# Patient Record
Sex: Male | Born: 1970 | Race: Black or African American | Hispanic: No | Marital: Married | Smoking: Never smoker
Health system: Southern US, Community
[De-identification: ages and names within clinical notes are randomized; demographics above are authoritative.]

## PROBLEM LIST (undated history)

## (undated) DIAGNOSIS — C801 Malignant (primary) neoplasm, unspecified: Secondary | ICD-10-CM

## (undated) DIAGNOSIS — K219 Gastro-esophageal reflux disease without esophagitis: Secondary | ICD-10-CM

## (undated) DIAGNOSIS — Z889 Allergy status to unspecified drugs, medicaments and biological substances status: Secondary | ICD-10-CM

## (undated) HISTORY — DX: Gastro-esophageal reflux disease without esophagitis: K21.9

---

## 2012-02-27 ENCOUNTER — Emergency Department (INDEPENDENT_AMBULATORY_CARE_PROVIDER_SITE_OTHER): Payer: Self-pay

## 2012-02-27 ENCOUNTER — Encounter (HOSPITAL_COMMUNITY): Payer: Self-pay | Admitting: *Deleted

## 2012-02-27 ENCOUNTER — Emergency Department (HOSPITAL_COMMUNITY)
Admission: EM | Admit: 2012-02-27 | Discharge: 2012-02-27 | Disposition: A | Discharge status: Home / Self Care | Payer: Self-pay | Source: Home / Self Care | Attending: Emergency Medicine | Admitting: Emergency Medicine

## 2012-02-27 ENCOUNTER — Emergency Department (INDEPENDENT_AMBULATORY_CARE_PROVIDER_SITE_OTHER)
Admission: EM | Admit: 2012-02-27 | Discharge: 2012-02-27 | Disposition: A | Discharge status: Home / Self Care | Payer: Self-pay | Source: Home / Self Care | Attending: Family Medicine | Admitting: Family Medicine

## 2012-02-27 DIAGNOSIS — IMO0002 Reserved for concepts with insufficient information to code with codable children: Secondary | ICD-10-CM

## 2012-02-27 DIAGNOSIS — S62619A Displaced fracture of proximal phalanx of unspecified finger, initial encounter for closed fracture: Secondary | ICD-10-CM

## 2012-02-27 NOTE — ED Notes (Signed)
Pt called x2 no answer 

## 2012-02-27 NOTE — ED Notes (Signed)
Pt called in lobby x 1. KDL EMT-P 

## 2012-02-27 NOTE — ED Notes (Signed)
Pt reports "jamming" finger while playing basketball yesterday.

## 2012-02-27 NOTE — ED Notes (Signed)
Pt called x3 answer no

## 2012-02-27 NOTE — ED Provider Notes (Signed)
History     CSN: [id number]  Arrival date & time 02/27/12  1609   First MD Initiated Contact with Patient 02/27/12 1636      Chief Complaint  Patient presents with  . Finger Injury    (Consider location/radiation/quality/duration/timing/severity/associated sxs/prior treatment) Patient is a 41 y.o. male presenting with hand pain. The history is provided by the patient and the spouse.  Hand Pain This is a new problem. The current episode started yesterday (jammed yest playing basketball when struck another players leg.).    History reviewed. No pertinent past medical history.  History reviewed. No pertinent past surgical history.  History reviewed. No pertinent family history.  History  Substance Use Topics  . Smoking status: Never Smoker   . Smokeless tobacco: Not on file  . Alcohol Use: No      Review of Systems  Constitutional: Negative.   Musculoskeletal: Positive for joint swelling.    Allergies  Review of patient's allergies indicates no known allergies.  Home Medications  No current outpatient prescriptions on file.  BP 128/82  Pulse 52  Temp 98.4 F (36.9 C) (Oral)  Resp 20  SpO2 100%  Physical Exam  Nursing note and vitals reviewed. Constitutional: He appears well-developed and well-nourished.  Musculoskeletal: He exhibits tenderness.       Hands: Skin: Skin is warm and dry.    ED Course  Procedures (including critical care time)  Labs Reviewed - No data to display Dg Finger Index Right  02/27/2012  *RADIOLOGY REPORT*  Clinical Data: Right index finger pain following an injury yesterday.  RIGHT INDEX FINGER 2+V  Comparison: None.  Findings: Hypertrophy of the ulnar aspect of the base of the second proximal phalanx with a fracture line extending through this portion of the base.  This includes the MCP joint.  Proximal soft tissue swelling.  IMPRESSION: Fracture of the base of the second proximal phalanx with intra- articular extension.    Original Report Authenticated By: Darrol Angel, M.D.      1. Fracture of proximal phalanx of finger       MDM  X-rays reviewed and report per radiologist.        Linna Hoff, MD 02/27/12 1910

## 2012-02-28 NOTE — ED Notes (Signed)
Patient her for work note ; discussed w Dr Konrad Saha

## 2012-08-28 ENCOUNTER — Emergency Department (HOSPITAL_COMMUNITY): Payer: Self-pay

## 2012-08-28 ENCOUNTER — Emergency Department (HOSPITAL_COMMUNITY)
Admission: EM | Admit: 2012-08-28 | Discharge: 2012-08-28 | Disposition: A | Discharge status: Home / Self Care | Payer: Self-pay | Attending: Emergency Medicine | Admitting: Emergency Medicine

## 2012-08-28 ENCOUNTER — Encounter (HOSPITAL_COMMUNITY): Payer: Self-pay | Admitting: Neurology

## 2012-08-28 DIAGNOSIS — J40 Bronchitis, not specified as acute or chronic: Secondary | ICD-10-CM

## 2012-08-28 DIAGNOSIS — J209 Acute bronchitis, unspecified: Secondary | ICD-10-CM | POA: Insufficient documentation

## 2012-08-28 DIAGNOSIS — R0789 Other chest pain: Secondary | ICD-10-CM | POA: Insufficient documentation

## 2012-08-28 MED ORDER — ALBUTEROL SULFATE HFA 108 (90 BASE) MCG/ACT IN AERS
2.0000 | INHALATION_SPRAY | RESPIRATORY_TRACT | Status: DC | PRN
  Administered 2012-08-28: 2 via RESPIRATORY_TRACT
  Filled 2012-08-28 (×2): qty 6.7

## 2012-08-28 MED ORDER — BENZONATATE 100 MG PO CAPS: 200.0000 mg | ORAL_CAPSULE | Freq: Two times a day (BID) | ORAL | Status: DC | PRN

## 2012-08-28 NOTE — ED Notes (Signed)
Pt returned from xray

## 2012-08-28 NOTE — ED Notes (Signed)
Pt left inhaler on bed after being discharged. Went to lobby to look for patient. Did not see patient. Inhaler was discarded. Pt mother came back requesting an inhaler, another one was pulled from the pyxis.

## 2012-08-28 NOTE — ED Provider Notes (Signed)
Medical screening examination/treatment/procedure(s) were performed by non-physician practitioner and as supervising physician I was immediately available for consultation/collaboration.   Shelda Jakes, MD 08/28/12 [phone]

## 2012-08-28 NOTE — ED Provider Notes (Signed)
History     CSN: [id number]  Arrival date & time 08/28/12  0826   First MD Initiated Contact with Patient 08/28/12 0845      Chief Complaint  Patient presents with  . Cough    (Consider location/radiation/quality/duration/timing/severity/associated sxs/prior treatment) HPI Comments: This is a 42 year old male, with no past medical history, who presents emergency department with chief complaint of cough. Patient states that he's been having symptoms for the past month. He states that he has had some new green sputum which she coughs up. Additionally, he endorses some subjective fever and chills. He states that he is tried taking Mucinex and other over-the-counter medicines with some relief. He states that he has had some chest pain, which has been intermittent, and he attributes it to the cough or to working out. It has not been associated with shortness of breath or diaphoresis. He has no known risk factors for heart disease.  The history is provided by the patient. No language interpreter was used.    History reviewed. No pertinent past medical history.  History reviewed. No pertinent past surgical history.  No family history on file.  History  Substance Use Topics  . Smoking status: Never Smoker   . Smokeless tobacco: Not on file  . Alcohol Use: No      Review of Systems  All other systems reviewed and are negative.    Allergies  Review of patient's allergies indicates no known allergies.  Home Medications   Current Outpatient Rx  Name  Route  Sig  Dispense  Refill  . loratadine (CLARITIN) 10 MG tablet   Oral   Take 10 mg by mouth daily as needed for allergies.         . Multiple Vitamin (MULTIVITAMIN WITH MINERALS) TABS   Oral   Take 1 tablet by mouth daily.         Marland Kitchen Phenyleph-CPM-DM-APAP (ALKA-SELTZER PLUS COLD & FLU) 11-02-08-250 MG TBEF   Oral   Take 2 tablets by mouth daily as needed (for cold symptoms).           BP 129/85  Pulse 68   Temp(Src) 97.9 F (36.6 C) (Oral)  Resp 20  Ht 6\' 3"  (1.905 m)  Wt 250 lb (113.399 kg)  BMI 31.25 kg/m2  SpO2 98%  Physical Exam  Nursing note and vitals reviewed. Constitutional: He is oriented to person, place, and time. He appears well-developed and well-nourished.  HENT:  Head: Normocephalic and atraumatic.  Right Ear: External ear normal.  Left Ear: External ear normal.  Nose: Nose normal.  Mouth/Throat: Oropharynx is clear and moist. No oropharyngeal exudate.  Eyes: Conjunctivae and EOM are normal. Pupils are equal, round, and reactive to light. Right eye exhibits no discharge. Left eye exhibits no discharge. No scleral icterus.  Neck: Normal range of motion. Neck supple. No JVD present.  Cardiovascular: Normal rate, regular rhythm, normal heart sounds and intact distal pulses.  Exam reveals no gallop and no friction rub.   No murmur heard. Pulmonary/Chest: Effort normal and breath sounds normal. No respiratory distress. He has no wheezes. He has no rales. He exhibits no tenderness.  Abdominal: Soft. Bowel sounds are normal. He exhibits no distension and no mass. There is no tenderness. There is no rebound and no guarding.  Musculoskeletal: Normal range of motion. He exhibits no edema and no tenderness.  Neurological: He is alert and oriented to person, place, and time.  CN 3-12 grossly intact  Skin: Skin is warm and  dry.  Psychiatric: He has a normal mood and affect. His behavior is normal. Judgment and thought content normal.    ED Course  Procedures (including critical care time)  No results found for this or any previous visit. Dg Chest 2 View  08/28/2012  *RADIOLOGY REPORT*  Clinical Data: Shortness of breath  CHEST - 2 VIEW  Comparison: None.  Findings: There is no focal infiltrate, pulmonary edema, or pleural effusion. There is minimal biapical pleural thickening. The mediastinal contour and cardiac silhouette are normal.  There is minimal scoliosis of spine.  The soft  tissues are normal.  IMPRESSION: No acute cardiopulmonary disease identified.   Original Report Authenticated By: Sherian Rein, M.D.       1. Bronchitis       MDM  42 year old male with cough. I suspect that this is likely bronchitis, however the patient asked to be evaluated for pneumonia. I will order a chest x-ray, and will reevaluate patient is not in any apparent distress this time.  9:59 AM Chest x-ray shows no evidence of pneumonia. I will give the patient Tessalon Perles and an inhaler. Patient is afebrile, no risk factors for heart disease, no shortness of breath, no tachycardia, doubt PE, will discharge with return precautions. Patient is stable and ready for discharge.       Roxy Horseman, PA-C 08/28/12 1000

## 2012-08-28 NOTE — ED Notes (Signed)
Reports cold on and off for about month. Productive green sputum, states sore from coughing. Denies pain other than soreness. Pt is alert and oriented. States chills off and on.

## 2012-08-28 NOTE — ED Notes (Signed)
Patient transported to X-ray 

## 2013-06-09 ENCOUNTER — Emergency Department (INDEPENDENT_AMBULATORY_CARE_PROVIDER_SITE_OTHER)
Admission: EM | Admit: 2013-06-09 | Discharge: 2013-06-09 | Disposition: A | Discharge status: Home / Self Care | Payer: PRIVATE HEALTH INSURANCE | Source: Home / Self Care | Attending: Family Medicine | Admitting: Family Medicine

## 2013-06-09 ENCOUNTER — Encounter (HOSPITAL_COMMUNITY): Payer: Self-pay | Admitting: Emergency Medicine

## 2013-06-09 ENCOUNTER — Emergency Department (INDEPENDENT_AMBULATORY_CARE_PROVIDER_SITE_OTHER): Payer: Self-pay

## 2013-06-09 DIAGNOSIS — M7650 Patellar tendinitis, unspecified knee: Secondary | ICD-10-CM

## 2013-06-09 DIAGNOSIS — M7652 Patellar tendinitis, left knee: Secondary | ICD-10-CM

## 2013-06-09 MED ORDER — DICLOFENAC POTASSIUM 50 MG PO TABS: 50.0000 mg | ORAL_TABLET | Freq: Three times a day (TID) | ORAL | Status: DC

## 2013-06-09 NOTE — ED Provider Notes (Signed)
CSN: [id number]     Arrival date & time 06/09/13  1512 History   First MD Initiated Contact with Patient 06/09/13 1518     Chief Complaint  Patient presents with  . Knee Injury   (Consider location/radiation/quality/duration/timing/severity/associated sxs/prior Treatment) Patient is a 42 y.o. male presenting with knee pain. The history is provided by the patient and the spouse.  Knee Pain Location:  Knee Time since incident:  2 days Injury: no   Knee location:  L knee Pain details:    Quality:  Burning   Radiates to:  Does not radiate   Severity:  Mild   Progression:  Worsening (playing a lot of bball last week, pain with jumping) Chronicity:  New Dislocation: no   Prior injury to area:  No Associated symptoms: no back pain     History reviewed. No pertinent past medical history. History reviewed. No pertinent past surgical history. No family history on file. History  Substance Use Topics  . Smoking status: Never Smoker   . Smokeless tobacco: Not on file  . Alcohol Use: No    Review of Systems  Constitutional: Negative.   Musculoskeletal: Negative for back pain, gait problem and joint swelling.    Allergies  Review of patient's allergies indicates no known allergies.  Home Medications   Current Outpatient Rx  Name  Route  Sig  Dispense  Refill  . benzonatate (TESSALON) 100 MG capsule   Oral   Take 2 capsules (200 mg total) by mouth 2 (two) times daily as needed for cough.   20 capsule   0   . diclofenac (CATAFLAM) 50 MG tablet   Oral   Take 1 tablet (50 mg total) by mouth 3 (three) times daily. For knee pain   30 tablet   1   . loratadine (CLARITIN) 10 MG tablet   Oral   Take 10 mg by mouth daily as needed for allergies.         . Multiple Vitamin (MULTIVITAMIN WITH MINERALS) TABS   Oral   Take 1 tablet by mouth daily.         Marland Kitchen Phenyleph-CPM-DM-APAP (ALKA-SELTZER PLUS COLD & FLU) 11-02-08-250 MG TBEF   Oral   Take 2 tablets by mouth daily as  needed (for cold symptoms).          BP 132/73  Pulse 65  Temp(Src) 98.1 F (36.7 C) (Oral)  Resp 18  SpO2 98% Physical Exam  Nursing note and vitals reviewed. Constitutional: He is oriented to person, place, and time. He appears well-developed and well-nourished.  Musculoskeletal: He exhibits tenderness.       Left knee: He exhibits normal range of motion, no swelling, no effusion, no deformity, normal alignment, no LCL laxity, normal patellar mobility, no bony tenderness and no MCL laxity. Tenderness found. Patellar tendon tenderness noted. No medial joint line and no lateral joint line tenderness noted.       Legs: Neurological: He is alert and oriented to person, place, and time.  Skin: Skin is warm and dry.    ED Course  Procedures (including critical care time) Labs Review Labs Reviewed - No data to display Imaging Review Dg Knee Complete 4 Views Left  06/09/2013   CLINICAL DATA:  Pain and swelling.  EXAM: LEFT KNEE - COMPLETE 4+ VIEW  COMPARISON:  None.  FINDINGS: Moderate degenerative changes with joint space narrowing and osteophytic spurring. No acute fracture or osteochondral lesion. Remote changes of patellar tendinitis. Small joint effusion.  IMPRESSION: Moderate degenerative changes and small joint effusion but no acute fracture.   Electronically Signed   By: Loralie Champagne M.D.   On: 06/09/2013 15:46    EKG Interpretation    Date/Time:    Ventricular Rate:    PR Interval:    QRS Duration:   QT Interval:    QTC Calculation:   R Axis:     Text Interpretation:              MDM  X-rays reviewed and report per radiologist.     Linna Hoff, MD 06/09/13 [phone]

## 2013-06-09 NOTE — ED Notes (Addendum)
PT HERE WITH C/O LOCALIZED ANTERIOR KNEE PAIN WITH LIMITED FLEXION STATES SHE WAS PLAYING BASKETBALL fRIDAY WHEN S STARTED 6/10 WITH PRESSURE. PT TOOK IBUPROFEN OTC

## 2013-06-14 ENCOUNTER — Emergency Department (INDEPENDENT_AMBULATORY_CARE_PROVIDER_SITE_OTHER)
Admission: EM | Admit: 2013-06-14 | Discharge: 2013-06-14 | Disposition: A | Discharge status: Home / Self Care | Payer: Self-pay | Source: Home / Self Care | Attending: Family Medicine | Admitting: Family Medicine

## 2013-06-14 ENCOUNTER — Encounter (HOSPITAL_COMMUNITY): Payer: Self-pay | Admitting: Emergency Medicine

## 2013-06-14 DIAGNOSIS — M109 Gout, unspecified: Secondary | ICD-10-CM

## 2013-06-14 LAB — BASIC METABOLIC PANEL
BUN: 13 mg/dL (ref 6–23)
Calcium: 9.5 mg/dL (ref 8.4–10.5)
Creatinine, Ser: 1.11 mg/dL (ref 0.50–1.35)
GFR calc non Af Amer: 79 mL/min — ABNORMAL LOW (ref >90)
Glucose, Bld: 73 mg/dL (ref 70–99)

## 2013-06-14 LAB — URIC ACID: Uric Acid, Serum: 5.4 mg/dL (ref 4.0–7.8)

## 2013-06-14 MED ORDER — PREDNISONE 10 MG PO KIT: PACK | ORAL | Status: DC

## 2013-06-14 MED ORDER — HYDROCODONE-ACETAMINOPHEN 5-325 MG PO TABS: 1.0000 | ORAL_TABLET | Freq: Four times a day (QID) | ORAL | Status: DC | PRN

## 2013-06-14 NOTE — ED Provider Notes (Signed)
Harold Perez is a 42 y.o. male who presents to Urgent Care today for right great toe pain present for 2 days with no injury. Patient was seen on December 7 for left knee pain that occurred without injury. He was tentatively diagnosed with patellar tendinitis given diclofenac which has helped only a little. His right great toe at the first MTP joint became swollen and very painful especially worse with ambulation 2 days ago. He has no prior history of gout. Diclofenac has not been helpful for the toe pain.    History reviewed. No pertinent past medical history. History  Substance Use Topics  . Smoking status: Never Smoker   . Smokeless tobacco: Not on file  . Alcohol Use: No   ROS as above Medications reviewed. No current facility-administered medications for this encounter.   Current Outpatient Prescriptions  Medication Sig Dispense Refill  . HYDROcodone-acetaminophen (NORCO/VICODIN) 5-325 MG per tablet Take 1 tablet by mouth every 6 (six) hours as needed.  15 tablet  0  . PredniSONE 10 MG KIT 12 day dose pack po  1 kit  0  . [DISCONTINUED] loratadine (CLARITIN) 10 MG tablet Take 10 mg by mouth daily as needed for allergies.        Exam:  BP 140/87  Pulse 81  Temp(Src) 98.7 F (37.1 C) (Oral)  Resp 18  SpO2 100% Gen: Well NAD Right great toe. First MTP joint tender or swollen pain with motion. Capillary refill and sensation are intact distally.    Assessment and Plan: 42 y.o. male with probable gout flare. Plan to treat with prednisone, and Norco. Would like use colchicine but patient cannot afford it as he does not have health insurance. Uric acid and basic metabolic panel pending. Discussed warning signs or symptoms. Please see discharge instructions. Patient expresses understanding.      Rodolph Bong, MD 06/14/13 [phone]

## 2013-06-14 NOTE — ED Notes (Signed)
Pt  reports  r  Big  Toe  Pain   X  2  Days  denys  specefic  Injury       She  Recently  For l  Knee  Pain as  Well

## 2013-06-17 NOTE — ED Notes (Signed)
Uric Acid 5.4. Sent to Dr. Denyse Amass. Vassie Moselle 06/17/2013

## 2013-07-25 ENCOUNTER — Ambulatory Visit (INDEPENDENT_AMBULATORY_CARE_PROVIDER_SITE_OTHER): Payer: PRIVATE HEALTH INSURANCE | Admitting: Internal Medicine

## 2013-07-25 ENCOUNTER — Encounter: Payer: Self-pay | Admitting: Internal Medicine

## 2013-07-25 VITALS — BP 132/80 | HR 65 | Temp 98.5°F | Ht 73.5 in | Wt 243.8 lb

## 2013-07-25 DIAGNOSIS — K219 Gastro-esophageal reflux disease without esophagitis: Secondary | ICD-10-CM

## 2013-07-25 DIAGNOSIS — Z23 Encounter for immunization: Secondary | ICD-10-CM

## 2013-07-25 DIAGNOSIS — M79676 Pain in unspecified toe(s): Secondary | ICD-10-CM

## 2013-07-25 DIAGNOSIS — K5900 Constipation, unspecified: Secondary | ICD-10-CM | POA: Insufficient documentation

## 2013-07-25 DIAGNOSIS — Z0000 Encounter for general adult medical examination without abnormal findings: Secondary | ICD-10-CM

## 2013-07-25 DIAGNOSIS — R351 Nocturia: Secondary | ICD-10-CM

## 2013-07-25 DIAGNOSIS — M79609 Pain in unspecified limb: Secondary | ICD-10-CM

## 2013-07-25 DIAGNOSIS — Z8042 Family history of malignant neoplasm of prostate: Secondary | ICD-10-CM

## 2013-07-25 DIAGNOSIS — M7989 Other specified soft tissue disorders: Secondary | ICD-10-CM

## 2013-07-25 LAB — CBC
HEMATOCRIT: 40.3 % (ref 39.0–52.0)
HEMOGLOBIN: 13.2 g/dL (ref 13.0–17.0)
MCHC: 32.7 g/dL (ref 30.0–36.0)
MCV: 85.4 fl (ref 78.0–100.0)
PLATELETS: 243 10*3/uL (ref 150.0–400.0)
RBC: 4.72 Mil/uL (ref 4.22–5.81)
RDW: 14.3 % (ref 11.5–14.6)
WBC: 7.4 10*3/uL (ref 4.5–10.5)

## 2013-07-25 LAB — COMPREHENSIVE METABOLIC PANEL
ALT: 27 U/L (ref 0–53)
AST: 22 U/L (ref 0–37)
Albumin: 3.9 g/dL (ref 3.5–5.2)
Alkaline Phosphatase: 94 U/L (ref 39–117)
BILIRUBIN TOTAL: 0.9 mg/dL (ref 0.3–1.2)
BUN: 16 mg/dL (ref 6–23)
CALCIUM: 9.9 mg/dL (ref 8.4–10.5)
CHLORIDE: 102 meq/L (ref 96–112)
CO2: 26 meq/L (ref 19–32)
CREATININE: 1.2 mg/dL (ref 0.4–1.5)
GFR: 85.25 mL/min (ref >60.00)
GLUCOSE: 87 mg/dL (ref 70–99)
Potassium: 4.2 mEq/L (ref 3.5–5.1)
Sodium: 138 mEq/L (ref 135–145)
Total Protein: 8.8 g/dL — ABNORMAL HIGH (ref 6.0–8.3)

## 2013-07-25 LAB — LIPID PANEL
CHOLESTEROL: 214 mg/dL — AB (ref 0–200)
HDL: 41.2 mg/dL (ref >39.00)
Total CHOL/HDL Ratio: 5
Triglycerides: 107 mg/dL (ref 0.0–149.0)
VLDL: 21.4 mg/dL (ref 0.0–40.0)

## 2013-07-25 LAB — TSH: TSH: 1.16 u[IU]/mL (ref 0.35–5.50)

## 2013-07-25 LAB — URIC ACID: Uric Acid, Serum: 6.2 mg/dL (ref 4.0–7.8)

## 2013-07-25 LAB — HEMOGLOBIN A1C: HEMOGLOBIN A1C: 7 % — AB (ref 4.6–6.5)

## 2013-07-25 LAB — PSA: PSA: 9.91 ng/mL — ABNORMAL HIGH (ref 0.10–4.00)

## 2013-07-25 LAB — LDL CHOLESTEROL, DIRECT: LDL DIRECT: 159.5 mg/dL

## 2013-07-25 MED ORDER — ESOMEPRAZOLE MAGNESIUM 40 MG PO PACK: 40.0000 mg | PACK | Freq: Every day | ORAL | Status: DC

## 2013-07-25 NOTE — Assessment & Plan Note (Signed)
Advised pt to increase his fluid intake Try a probiotic like phillips colon health daily

## 2013-07-25 NOTE — Progress Notes (Signed)
HPI Pt presents to the clinic today to establish care. Harold Perez has not had a PCP in the last 10 years. Harold Perez goes to [facility] when needed. Harold Perez does have a few concerns today. 1- Harold Perez has been having reflux. It seems like everything Harold Perez eats is giving him reflux. Harold Perez has tried tums without relief. Harold Perez has started keeping a food diary of everything that gives him reflux, but so far Harold Perez has been unable to find a pattern. Harold Perez does not chew gum, eat mints or drink alcohol in excess. 2- Harold Perez was diagnosed this past year with gout in his right big toe. There was no testing done, they looked at his toe and told him Harold Perez had gout. It did resolve with the prednisone but Harold Perez wants to know for sure if it is gout or not.  Flu: 05/2013 Tetanus: more than 10 years Eye Doctor: as needed Dentist: as needed PSA screening: Oct 19, 2011- dad died last year of prostate ca Past Medical History  Diagnosis Date  . GERD (gastroesophageal reflux disease)     Current Outpatient Prescriptions  Medication Sig Dispense Refill  . Multiple Vitamin (MULTIVITAMIN) tablet Take 1 tablet by mouth daily.      . [DISCONTINUED] loratadine (CLARITIN) 10 MG tablet Take 10 mg by mouth daily as needed for allergies.       No current facility-administered medications for this visit.    No Known Allergies  Family History  Problem Relation Age of Onset  . Hypertension Mother   . Diabetes Mother   . Cancer Mother     breast  . Cancer Father     prostate  . Hypertension Father     History   Social History  . Marital Status: Married    Spouse Name: N/A    Number of Children: N/A  . Years of Education: N/A   Occupational History  . Not on file.   Social History Main Topics  . Smoking status: Never Smoker   . Smokeless tobacco: Never Used  . Alcohol Use: No  . Drug Use: No  . Sexual Activity: Yes   Other Topics Concern  . Not on file   Social History Narrative  . No narrative on file    ROS:  Constitutional: Denies fever, malaise, fatigue,  headache or abrupt weight changes.  HEENT: Denies eye pain, eye redness, ear pain, ringing in the ears, wax buildup, runny nose, nasal congestion, bloody nose, or sore throat. Respiratory: Denies difficulty breathing, shortness of breath, cough or sputum production.   Cardiovascular: Denies chest pain, chest tightness, palpitations or swelling in the hands or feet.  Gastrointestinal: Pt reports constipation. Denies abdominal pain, bloating, diarrhea or blood in the stool.  GU: Pt reports nocturia. Denies frequency, urgency, pain with urination, blood in urine, odor or discharge. Musculoskeletal: Pt reports occasional joint pain and swelling of the right big toe. Denies decrease in range of motion, difficulty with gait, muscle pain.  Skin: Denies redness, rashes, lesions or ulcercations.  Neurological: Denies dizziness, difficulty with memory, difficulty with speech or problems with balance and coordination.   No other specific complaints in a complete review of systems (except as listed in HPI above).  PE:  BP 132/80  Pulse 65  Temp(Src) 98.5 F (36.9 C) (Oral)  Ht 6' 1.5" (1.867 m)  Wt 243 lb 12 oz (110.564 kg)  BMI 31.72 kg/m2  SpO2 98% Wt Readings from Last 3 Encounters:  07/25/13 243 lb 12 oz (110.564 kg)  08/28/12 250 lb (  113.399 kg)    General: Appears his stated age, well developed, well nourished in NAD. HEENT: Head: normal shape and size; Eyes: sclera white, no icterus, conjunctiva pink, PERRLA and EOMs intact; Ears: Tm's gray and intact, normal light reflex; Nose: mucosa pink and moist, septum midline; Throat/Mouth: Teeth present, mucosa pink and moist, no lesions or ulcerations noted.  Neck: Normal range of motion. Neck supple, trachea midline. No massses, lumps or thyromegaly present.  Cardiovascular: Normal rate and rhythm. S1,S2 noted.  No murmur, rubs or gallops noted. No JVD or BLE edema. No carotid bruits noted. Pulmonary/Chest: Normal effort and positive vesicular  breath sounds. No respiratory distress. No wheezes, rales or ronchi noted.  Abdomen: Soft and nontender. Normal bowel sounds, no bruits noted. No distention or masses noted. Liver, spleen and kidneys non palpable. Musculoskeletal: Normal range of motion. No signs of joint swelling. No difficulty with gait.  Neurological: Alert and oriented. Cranial nerves II-XII intact. Coordination normal. +DTRs bilaterally. Psychiatric: Mood and affect normal. Behavior is normal. Judgment and thought content normal.    BMET    Component Value Date/Time   NA 136 06/14/2013 1436   K 3.8 06/14/2013 1436   CL 99 06/14/2013 1436   CO2 26 06/14/2013 1436   GLUCOSE 73 06/14/2013 1436   BUN 13 06/14/2013 1436   CREATININE 1.11 06/14/2013 1436   CALCIUM 9.5 06/14/2013 1436   GFRNONAA 79* 06/14/2013 1436   GFRAA >90 06/14/2013 1436       Assessment and Plan:  Preventative Health Maintenance:  Tdap given today Encouraged pt to visit eye doctor and dentist on a more routine basis Will check labs today including PSA  Toe swelling and pain:  Will check uric acid to determine if this is likely gout If so, will give RX for indomethacin  RTC in 1 year or sooner if needed

## 2013-07-25 NOTE — Assessment & Plan Note (Signed)
Pt declines DRE today to check for BPH He will try to decrease his fluid intake before bed

## 2013-07-25 NOTE — Patient Instructions (Signed)

## 2013-07-25 NOTE — Progress Notes (Signed)
Pre-visit discussion using our clinic review tool. No additional management support is needed unless otherwise documented below in the visit note.  

## 2013-07-25 NOTE — Assessment & Plan Note (Signed)
Continue to keep a log to try to recognize trend Information given for Diet for GERD Will start Nexium 40 mg daily

## 2013-08-02 ENCOUNTER — Encounter: Payer: Self-pay | Admitting: Internal Medicine

## 2013-08-02 ENCOUNTER — Ambulatory Visit (INDEPENDENT_AMBULATORY_CARE_PROVIDER_SITE_OTHER): Payer: PRIVATE HEALTH INSURANCE | Admitting: Internal Medicine

## 2013-08-02 VITALS — BP 124/78 | HR 71 | Temp 98.2°F | Wt 246.0 lb

## 2013-08-02 DIAGNOSIS — E119 Type 2 diabetes mellitus without complications: Secondary | ICD-10-CM | POA: Insufficient documentation

## 2013-08-02 DIAGNOSIS — E781 Pure hyperglyceridemia: Secondary | ICD-10-CM | POA: Insufficient documentation

## 2013-08-02 DIAGNOSIS — E669 Obesity, unspecified: Secondary | ICD-10-CM

## 2013-08-02 DIAGNOSIS — E785 Hyperlipidemia, unspecified: Secondary | ICD-10-CM | POA: Insufficient documentation

## 2013-08-02 DIAGNOSIS — R972 Elevated prostate specific antigen [PSA]: Secondary | ICD-10-CM | POA: Insufficient documentation

## 2013-08-02 NOTE — Progress Notes (Signed)
Pre-visit discussion using our clinic review tool. No additional management support is needed unless otherwise documented below in the visit note.  

## 2013-08-02 NOTE — Assessment & Plan Note (Signed)
Is going to try 3 months of lifestyle changes Specific diet plan given

## 2013-08-02 NOTE — Assessment & Plan Note (Signed)
Family history of prostate CA He does report that his PSA was normal last year Will refer to urology for further evaluation   See Rosaria Ferries on your way out to schedule your appt

## 2013-08-02 NOTE — Assessment & Plan Note (Signed)
A1C 7.0 Will plan 3 months of lifestyle changes Specific Diet plan given Foot exam today  Will bring him back in 3 months to recheck A1C, if elevated will start Metformin

## 2013-08-02 NOTE — Assessment & Plan Note (Signed)
He does consume a moderate amount of fast food and fried foods He will cut back on this He plans to increase his aerobic exercise  Will recheck lipid profile in 3 months

## 2013-08-02 NOTE — Patient Instructions (Signed)
Type 2 Diabetes Mellitus, Adult Type 2 diabetes mellitus, often simply referred to as type 2 diabetes, is a long-lasting (chronic) disease. In type 2 diabetes, the pancreas does not make enough insulin (a hormone), the cells are less responsive to the insulin that is made (insulin resistance), or both. Normally, insulin moves sugars from food into the tissue cells. The tissue cells use the sugars for energy. The lack of insulin or the lack of normal response to insulin causes excess sugars to build up in the blood instead of going into the tissue cells. As a result, high blood sugar (hyperglycemia) develops. The effect of high sugar (glucose) levels can cause many complications. Type 2 diabetes was also previously called adult-onset diabetes but it can occur at any age.  RISK FACTORS  A person is predisposed to developing type 2 diabetes if someone in the family has the disease and also has one or more of the following primary risk factors:  Overweight.  An inactive lifestyle.  A history of consistently eating high-calorie foods. Maintaining a normal weight and regular physical activity can reduce the chance of developing type 2 diabetes. SYMPTOMS  A person with type 2 diabetes may not show symptoms initially. The symptoms of type 2 diabetes appear slowly. The symptoms include:  Increased thirst (polydipsia).  Increased urination (polyuria).  Increased urination during the night (nocturia).  Weight loss. This weight loss may be rapid.  Frequent, recurring infections.  Tiredness (fatigue).  Weakness.  Vision changes, such as blurred vision.  Fruity smell to your breath.  Abdominal pain.  Nausea or vomiting.  Cuts or bruises which are slow to heal.  Tingling or numbness in the hands or feet. DIAGNOSIS Type 2 diabetes is frequently not diagnosed until complications of diabetes are present. Type 2 diabetes is diagnosed when symptoms or complications are present and when blood  glucose levels are increased. Your blood glucose level may be checked by one or more of the following blood tests:  A fasting blood glucose test. You will not be allowed to eat for at least 8 hours before a blood sample is taken.  A random blood glucose test. Your blood glucose is checked at any time of the day regardless of when you ate.  A hemoglobin A1c blood glucose test. A hemoglobin A1c test provides information about blood glucose control over the previous 3 months.  An oral glucose tolerance test (OGTT). Your blood glucose is measured after you have not eaten (fasted) for 2 hours and then after you drink a glucose-containing beverage. TREATMENT   You may need to take insulin or diabetes medicine daily to keep blood glucose levels in the desired range.  You will need to match insulin dosing with exercise and healthy food choices. The treatment goal is to maintain the before meal blood sugar (preprandial glucose) level at 70 130 mg/dL. HOME CARE INSTRUCTIONS   Have your hemoglobin A1c level checked twice a year.  Perform daily blood glucose monitoring as directed by your caregiver.  Monitor urine ketones when you are ill and as directed by your caregiver.  Take your diabetes medicine or insulin as directed by your caregiver to maintain your blood glucose levels in the desired range.  Never run out of diabetes medicine or insulin. It is needed every day.  Adjust insulin based on your intake of carbohydrates. Carbohydrates can raise blood glucose levels but need to be included in your diet. Carbohydrates provide vitamins, minerals, and fiber which are an essential part of   a healthy diet. Carbohydrates are found in fruits, vegetables, whole grains, dairy products, legumes, and foods containing added sugars.    Eat healthy foods. Alternate 3 meals with 3 snacks.  Lose weight if overweight.  Carry a medical alert card or wear your medical alert jewelry.  Carry a 15 gram  carbohydrate snack with you at all times to treat low blood glucose (hypoglycemia). Some examples of 15 gram carbohydrate snacks include:  Glucose tablets, 3 or 4   Glucose gel, 15 gram tube  Raisins, 2 tablespoons (24 grams)  Jelly beans, 6  Animal crackers, 8  Regular pop, 4 ounces (120 mL)  Gummy treats, 9  Recognize hypoglycemia. Hypoglycemia occurs with blood glucose levels of 70 mg/dL and below. The risk for hypoglycemia increases when fasting or skipping meals, during or after intense exercise, and during sleep. Hypoglycemia symptoms can include:  Tremors or shakes.  Decreased ability to concentrate.  Sweating.  Increased heart rate.  Headache.  Dry mouth.  Hunger.  Irritability.  Anxiety.  Restless sleep.  Altered speech or coordination.  Confusion.  Treat hypoglycemia promptly. If you are alert and able to safely swallow, follow the 15:15 rule:  Take 15 20 grams of rapid-acting glucose or carbohydrate. Rapid-acting options include glucose gel, glucose tablets, or 4 ounces (120 mL) of fruit juice, regular soda, or low fat milk.  Check your blood glucose level 15 minutes after taking the glucose.  Take 15 20 grams more of glucose if the repeat blood glucose level is still 70 mg/dL or below.  Eat a meal or snack within 1 hour once blood glucose levels return to normal.    Be alert to polyuria and polydipsia which are early signs of hyperglycemia. An early awareness of hyperglycemia allows for prompt treatment. Treat hyperglycemia as directed by your caregiver.  Engage in at least 150 minutes of moderate-intensity physical activity a week, spread over at least 3 days of the week or as directed by your caregiver. In addition, you should engage in resistance exercise at least 2 times a week or as directed by your caregiver.  Adjust your medicine and food intake as needed if you start a new exercise or sport.  Follow your sick day plan at any time you  are unable to eat or drink as usual.  Avoid tobacco use.  Limit alcohol intake to no more than 1 drink per day for nonpregnant women and 2 drinks per day for men. You should drink alcohol only when you are also eating food. Talk with your caregiver whether alcohol is safe for you. Tell your caregiver if you drink alcohol several times a week.  Follow up with your caregiver regularly.  Schedule an eye exam soon after the diagnosis of type 2 diabetes and then annually.  Perform daily skin and foot care. Examine your skin and feet daily for cuts, bruises, redness, nail problems, bleeding, blisters, or sores. A foot exam by a caregiver should be done annually.  Brush your teeth and gums at least twice a day and floss at least once a day. Follow up with your dentist regularly.  Share your diabetes management plan with your workplace or school.  Stay up-to-date with immunizations.  Learn to manage stress.  Obtain ongoing diabetes education and support as needed.  Participate in, or seek rehabilitation as needed to maintain or improve independence and quality of life. Request a physical or occupational therapy referral if you are having foot or hand numbness or difficulties with grooming,   dressing, eating, or physical activity. SEEK MEDICAL CARE IF:   You are unable to eat food or drink fluids for more than 6 hours.  You have nausea and vomiting for more than 6 hours.  Your blood glucose level is over 240 mg/dL.  There is a change in mental status.  You develop an additional serious illness.  You have diarrhea for more than 6 hours.  You have been sick or have had a fever for a couple of days and are not getting better.  You have pain during any physical activity.  SEEK IMMEDIATE MEDICAL CARE IF:  You have difficulty breathing.  You have moderate to large ketone levels. MAKE SURE YOU:  Understand these instructions.  Will watch your condition.  Will get help right away if  you are not doing well or get worse. Document Released: 06/20/2005 Document Revised: 03/14/2012 Document Reviewed: 01/17/2012 [employer] Patient Information 2014 [employer].  

## 2013-08-02 NOTE — Progress Notes (Signed)
Subjective:    Patient ID: Harold Perez, male    DOB: [birth year], 43 y.o.   MRN: [id number]  HPI  Pt presents to the clinic today to discuss lab results. His cholesterol was slightly elevated- total: 216, HDL: 46, LDL: 159. His PSA was elevated at 9.91. His father died of prostate cancer. His A1C was 7.0%. He does have a strong family history of diabetes.  Review of Systems      Past Medical History  Diagnosis Date  . GERD (gastroesophageal reflux disease)     Current Outpatient Prescriptions  Medication Sig Dispense Refill  . esomeprazole (NEXIUM) 40 MG packet Take 40 mg by mouth daily before breakfast.  30 each  12  . Multiple Vitamin (MULTIVITAMIN) tablet Take 1 tablet by mouth daily.      . [DISCONTINUED] loratadine (CLARITIN) 10 MG tablet Take 10 mg by mouth daily as needed for allergies.       No current facility-administered medications for this visit.    No Known Allergies  Family History  Problem Relation Age of Onset  . Hypertension Mother   . Diabetes Mother   . Cancer Mother     breast  . Cancer Father     prostate  . Hypertension Father     History   Social History  . Marital Status: Married    Spouse Name: N/A    Number of Children: N/A  . Years of Education: N/A   Occupational History  . Not on file.   Social History Main Topics  . Smoking status: Never Smoker   . Smokeless tobacco: Never Used  . Alcohol Use: No  . Drug Use: No  . Sexual Activity: Yes   Other Topics Concern  . Not on file   Social History Narrative  . No narrative on file     Constitutional: Denies fever, malaise, fatigue, headache or abrupt weight changes.  Respiratory: Denies difficulty breathing, shortness of breath, cough or sputum production.   Cardiovascular: Denies chest pain, chest tightness, palpitations or swelling in the hands or feet.  GU: Denies polyuria, polydipsia, urgency, frequency, pain with urination, burning sensation, blood in urine, odor or  discharge. Neurological: Denies numbness or tingling in the hands or feet, dizziness, difficulty with memory, difficulty with speech or problems with balance and coordination.   No other specific complaints in a complete review of systems (except as listed in HPI above).  Objective:   Physical Exam   BP 124/78  Pulse 71  Temp(Src) 98.2 F (36.8 C) (Oral)  Wt 246 lb (111.585 kg)  SpO2 98% Wt Readings from Last 3 Encounters:  08/02/13 246 lb (111.585 kg)  07/25/13 243 lb 12 oz (110.564 kg)  08/28/12 250 lb (113.399 kg)    General: Appears his stated age, overweight but well developed, well nourished in NAD. Cardiovascular: Normal rate and rhythm. S1,S2 noted.  No murmur, rubs or gallops noted. No JVD or BLE edema. No carotid bruits noted. Pulmonary/Chest: Normal effort and positive vesicular breath sounds. No respiratory distress. No wheezes, rales or ronchi noted.  Neurological: Alert and oriented. Sensation intact to 10 gm Monofilament.  BMET    Component Value Date/Time   NA 138 07/25/2013 1012   K 4.2 07/25/2013 1012   CL 102 07/25/2013 1012   CO2 26 07/25/2013 1012   GLUCOSE 87 07/25/2013 1012   BUN 16 07/25/2013 1012   CREATININE 1.2 07/25/2013 1012   CALCIUM 9.9 07/25/2013 1012   GFRNONAA 79* 06/14/2013 1436  GFRAA >90 06/14/2013 1436    Lipid Panel     Component Value Date/Time   CHOL 214* 07/25/2013 1012   TRIG 107.0 07/25/2013 1012   HDL 41.20 07/25/2013 1012   CHOLHDL 5 07/25/2013 1012   VLDL 21.4 07/25/2013 1012    CBC    Component Value Date/Time   WBC 7.4 07/25/2013 1012   RBC 4.72 07/25/2013 1012   HGB 13.2 07/25/2013 1012   HCT 40.3 07/25/2013 1012   PLT 243.0 07/25/2013 1012   MCV 85.4 07/25/2013 1012   MCHC 32.7 07/25/2013 1012   RDW 14.3 07/25/2013 1012    Hgb A1C Lab Results  Component Value Date   HGBA1C 7.0* 07/25/2013        Assessment & Plan:

## 2013-08-05 ENCOUNTER — Telehealth: Payer: Self-pay

## 2013-08-05 NOTE — Telephone Encounter (Signed)
Relevant patient education assigned to patient using Emmi. ° °

## 2013-08-27 ENCOUNTER — Ambulatory Visit: Payer: PRIVATE HEALTH INSURANCE | Admitting: Internal Medicine

## 2013-08-28 ENCOUNTER — Encounter: Payer: Self-pay | Admitting: Internal Medicine

## 2013-08-28 ENCOUNTER — Ambulatory Visit (INDEPENDENT_AMBULATORY_CARE_PROVIDER_SITE_OTHER): Payer: 59 | Admitting: Internal Medicine

## 2013-08-28 VITALS — BP 118/74 | HR 78 | Temp 98.4°F | Wt 248.0 lb

## 2013-08-28 DIAGNOSIS — M7662 Achilles tendinitis, left leg: Secondary | ICD-10-CM

## 2013-08-28 DIAGNOSIS — M7660 Achilles tendinitis, unspecified leg: Secondary | ICD-10-CM

## 2013-08-28 MED ORDER — PREDNISONE 10 MG PO TABS: ORAL_TABLET | ORAL | Status: DC

## 2013-08-28 NOTE — Progress Notes (Signed)
Subjective:    Patient ID: Harold Perez, male    DOB: 05/15/1971, 43 y.o.   MRN: [id number]  HPI  Pt presents to the clinic today with c/o pain in his left heel. He reports this started about 4 days ago. He does report that the pain radiates up to the back of his ankle. He noticed this when he woke up first thing in the morning. He denies injury to the area. He has taken 800 mg Ibuprofen OTC which did not provide any relief. He has not taken any antibiotics recently.  Review of Systems      Past Medical History  Diagnosis Date  . GERD (gastroesophageal reflux disease)     Current Outpatient Prescriptions  Medication Sig Dispense Refill  . esomeprazole (NEXIUM) 40 MG packet Take 40 mg by mouth daily before breakfast.  30 each  12  . Multiple Vitamin (MULTIVITAMIN) tablet Take 1 tablet by mouth daily.      . [DISCONTINUED] loratadine (CLARITIN) 10 MG tablet Take 10 mg by mouth daily as needed for allergies.       No current facility-administered medications for this visit.    No Known Allergies  Family History  Problem Relation Age of Onset  . Hypertension Mother   . Diabetes Mother   . Cancer Mother     breast  . Cancer Father     prostate  . Hypertension Father     History   Social History  . Marital Status: Married    Spouse Name: N/A    Number of Children: N/A  . Years of Education: N/A   Occupational History  . Not on file.   Social History Main Topics  . Smoking status: Never Smoker   . Smokeless tobacco: Never Used  . Alcohol Use: No  . Drug Use: No  . Sexual Activity: Yes   Other Topics Concern  . Not on file   Social History Narrative  . No narrative on file     Constitutional: Denies fever, malaise, fatigue, headache or abrupt weight changes.  Musculoskeletal: Pt reports pain in left heel. Denies decrease in range of motion, difficulty with gait, muscle pain or joint pain and swelling.   No other specific complaints in a complete review  of systems (except as listed in HPI above).  Objective:   Physical Exam   BP 118/74  Pulse 78  Temp(Src) 98.4 F (36.9 C) (Oral)  Wt 248 lb (112.492 kg)  SpO2 98% Wt Readings from Last 3 Encounters:  08/28/13 248 lb (112.492 kg)  08/02/13 246 lb (111.585 kg)  07/25/13 243 lb 12 oz (110.564 kg)    General: Appears his stated age, obese but well developed, well nourished in NAD. Cardiovascular: Normal rate and rhythm. S1,S2 noted.  No murmur, rubs or gallops noted. No JVD or BLE edema. No carotid bruits noted. Pulmonary/Chest: Normal effort and positive vesicular breath sounds. No respiratory distress. No wheezes, rales or ronchi noted.  Musculoskeletal: Normal plantarflexion, pain with dorsiflexion. Normal lateral rotation of the left ankle. Gait normal. Strength 5/5 BLE.    BMET    Component Value Date/Time   NA 138 07/25/2013 1012   K 4.2 07/25/2013 1012   CL 102 07/25/2013 1012   CO2 26 07/25/2013 1012   GLUCOSE 87 07/25/2013 1012   BUN 16 07/25/2013 1012   CREATININE 1.2 07/25/2013 1012   CALCIUM 9.9 07/25/2013 1012   GFRNONAA 79* 06/14/2013 1436   GFRAA >90 06/14/2013 1436  Lipid Panel     Component Value Date/Time   CHOL 214* 07/25/2013 1012   TRIG 107.0 07/25/2013 1012   HDL 41.20 07/25/2013 1012   CHOLHDL 5 07/25/2013 1012   VLDL 21.4 07/25/2013 1012    CBC    Component Value Date/Time   WBC 7.4 07/25/2013 1012   RBC 4.72 07/25/2013 1012   HGB 13.2 07/25/2013 1012   HCT 40.3 07/25/2013 1012   PLT 243.0 07/25/2013 1012   MCV 85.4 07/25/2013 1012   MCHC 32.7 07/25/2013 1012   RDW 14.3 07/25/2013 1012    Hgb A1C Lab Results  Component Value Date   HGBA1C 7.0* 07/25/2013        Assessment & Plan:   Left Achilles Tendonitis:   eRx for pred taper as Ibuprofen in ineffective Encouraged him to put ice on it BID for comfort Try to avoid any strenuous activity such as playing basketball, running x 2 weeks Ok to wear and ace wrap for comfort.  RTC as needed or  if pain persist or worsens. If no improvement in 2 weeks, followup with Dr. Lorelei Pont.

## 2013-08-28 NOTE — Progress Notes (Signed)
Pre visit review using our clinic review tool, if applicable. No additional management support is needed unless otherwise documented below in the visit note. 

## 2013-08-28 NOTE — Patient Instructions (Addendum)

## 2013-08-28 NOTE — Progress Notes (Signed)
HPI: Pt presents to the office today with concerns of left foot pain. Pain started Saturday and has steadily gotten worse. He states the pain is located in his left foot at the heel and shooting up into achilles tendon.  He does not have any pain at rest, however endorses increased pain with walking and movement. He has tried OTC Ibuprofen 800mg  and heat, with minimal relief. He denies any recent injury or antibiotic usage.   Past Medical History  Diagnosis Date  . GERD (gastroesophageal reflux disease)     Current Outpatient Prescriptions  Medication Sig Dispense Refill  . esomeprazole (NEXIUM) 40 MG packet Take 40 mg by mouth daily before breakfast.  30 each  12  . Multiple Vitamin (MULTIVITAMIN) tablet Take 1 tablet by mouth daily.      . predniSONE (DELTASONE) 10 MG tablet Take 3 tabs on days 1-2, take 2 tabs on days 3-4, take 1 tab on days 5-6  12 tablet  0  . [DISCONTINUED] loratadine (CLARITIN) 10 MG tablet Take 10 mg by mouth daily as needed for allergies.       No current facility-administered medications for this visit.    No Known Allergies  Family History  Problem Relation Age of Onset  . Hypertension Mother   . Diabetes Mother   . Cancer Mother     breast  . Cancer Father     prostate  . Hypertension Father     History   Social History  . Marital Status: Married    Spouse Name: N/A    Number of Children: N/A  . Years of Education: N/A   Occupational History  . Not on file.   Social History Main Topics  . Smoking status: Never Smoker   . Smokeless tobacco: Never Used  . Alcohol Use: No  . Drug Use: No  . Sexual Activity: Yes   Other Topics Concern  . Not on file   Social History Narrative  . No narrative on file    ROS:  Constitutional: Denies fever, malaise, fatigue, headache or abrupt weight changes.  Musculoskeletal:Endorses joint pain, swelling, and difficulty with gait.    No other specific complaints in a complete review of systems (except  as listed in HPI above).  PE:  BP 118/74  Pulse 78  Temp(Src) 98.4 F (36.9 C) (Oral)  Wt 248 lb (112.492 kg)  SpO2 98% Wt Readings from Last 3 Encounters:  08/28/13 248 lb (112.492 kg)  08/02/13 246 lb (111.585 kg)  07/25/13 243 lb 12 oz (110.564 kg)    General: Appears their stated age, well developed, well nourished in NAD. Cardiovascular: Normal rate and rhythm. S1,S2 noted.  No murmur, rubs or gallops noted. No JVD or BLE edema. No carotid bruits noted.. Musculoskeletal: Normal range of motion to right foot. Decreased/painful dorsal flexion. Achilles tendon tender to palpation with slight joint swelling noted.  Psychiatric: Mood and affect normal. Behavior is normal. Judgment and thought content normal.    Assessment and Plan: Achilles Tendonitis Rx Prednisone taper for 6 days Continue Ibuprofen for pain Rotate heat and ice; could try compression ace wrap F/U 2 week with Dr. Edilia Bo if symptoms are not better  Westley Foots, Student-NP

## 2013-09-26 ENCOUNTER — Other Ambulatory Visit: Payer: Self-pay | Admitting: Urology

## 2013-10-29 ENCOUNTER — Encounter (HOSPITAL_COMMUNITY): Payer: Self-pay | Admitting: Pharmacy Technician

## 2013-11-01 ENCOUNTER — Telehealth: Payer: Self-pay

## 2013-11-01 ENCOUNTER — Ambulatory Visit: Payer: PRIVATE HEALTH INSURANCE | Admitting: Internal Medicine

## 2013-11-01 DIAGNOSIS — Z0289 Encounter for other administrative examinations: Secondary | ICD-10-CM

## 2013-11-01 NOTE — Telephone Encounter (Signed)
spoke with pt and he states he forgot about his appt today and will call back to reschedule for next week

## 2013-11-01 NOTE — Telephone Encounter (Signed)
Ok thanks 

## 2013-11-05 NOTE — Patient Instructions (Signed)
Your procedure is scheduled on:  11/13/13  [facility]  Report to [place] [phone].  Call this number if you have problems the morning of surgery: [phone]      BOWEL PREP AS PER OFFICE  Do not take anything by mouth :After Midnight.TUESDAY NIGHT   Take these medicines the morning of surgery with A SIP OF WATER:none   .  Contacts, dentures or partial plates, or metal hairpins  can not be worn to surgery. Your family will be responsible for glasses, dentures, hearing aides while you are in surgery  Leave suitcase in the car. After surgery it may be brought to your room.  For patients admitted to the hospital, checkout time is 11:00 AM day of  discharge.                DO NOT WEAR JEWELRY, LOTIONS, POWDERS, OR PERFUMES.  WOMEN-- DO NOT SHAVE LEGS OR UNDERARMS FOR 48 HOURS BEFORE SHOWERS. MEN MAY SHAVE FACE. -------------------------------------------------------------------------------------------                                                          [facility] - Preparing for Surgery Before surgery, you can play an important role.  Because skin is not sterile, your skin needs to be as free of germs as possible.  You can reduce the number of germs on your skin by washing with CHG (chlorahexidine gluconate) soap before surgery.  CHG is an antiseptic cleaner which kills germs and bonds with the skin to continue killing germs even after washing. Please DO NOT use if you have an allergy to CHG or antibacterial soaps.  If your skin becomes reddened/irritated stop using the CHG and inform your nurse when you arrive at Short Stay. Do not shave (including legs and underarms) for at least 48 hours prior to the first CHG shower.  You may shave your face. Please follow these instructions carefully:  1.  Shower with CHG Soap the night before surgery and the  morning of Surgery.  2.  If you choose to wash your hair, wash your hair first as usual with your  normal  shampoo.  3.   After you shampoo, rinse your hair and body thoroughly to remove the  shampoo.                           4.  Use CHG as you would any other liquid soap.  You can apply chg directly  to the skin and wash                       Gently with a scrungie or clean washcloth.  5.  Apply the CHG Soap to your body ONLY FROM THE NECK DOWN.   Do not use on open                           Wound or open sores. Avoid contact with eyes, ears mouth and genitals (private parts).                        Genitals (private parts) with your normal soap.  6.  Wash thoroughly, paying special attention to the area where your surgery  will be performed.  7.  Thoroughly rinse your body with warm water from the neck down.  8.  DO NOT shower/wash with your normal soap after using and rinsing off  the CHG Soap.                9.  Pat yourself dry with a clean towel.            10.  Wear clean pajamas.            11.  Place clean sheets on your bed the night of your first shower and do not  sleep with pets. Day of Surgery : Do not apply any lotions/deodorants the morning of surgery.  Please wear clean clothes to the hospital/surgery center.  FAILURE TO FOLLOW THESE INSTRUCTIONS MAY RESULT IN THE CANCELLATION OF YOUR SURGERY PATIENT SIGNATURE_________________________________  NURSE SIGNATURE__________________________________  ________________________________________________________________________    CLEAR LIQUID DIET   Foods Allowed                                                                     Foods Excluded  Coffee and tea, regular and decaf                             liquids that you cannot  Plain Jell-O in any flavor                                             see through such as: Fruit ices (not with fruit pulp)                                     milk, soups, orange juice  Iced Popsicles                                    All solid food Carbonated beverages, regular and diet                                     Cranberry, grape and apple juices Sports drinks like Gatorade Lightly seasoned clear broth or consume(fat free) Sugar, honey syrup  Sample Menu Breakfast                                Lunch                                     Supper Cranberry juice                    Beef broth  Chicken broth Jell-O                                     Grape juice                           Apple juice Coffee or tea                        Jell-O                                      Popsicle                                                Coffee or tea                        Coffee or tea  _____________________________________________________________________   WHAT IS A BLOOD TRANSFUSION? Blood Transfusion Information  A transfusion is the replacement of blood or some of its parts. Blood is made up of multiple cells which provide different functions.  Red blood cells carry oxygen and are used for blood loss replacement.  White blood cells fight against infection.  Platelets control bleeding.  Plasma helps clot blood.  Other blood products are available for specialized needs, such as hemophilia or other clotting disorders. BEFORE THE TRANSFUSION  Who gives blood for transfusions?   Healthy volunteers who are fully evaluated to make sure their blood is safe. This is blood bank blood. Transfusion therapy is the safest it has ever been in the practice of medicine. Before blood is taken from a donor, a complete history is taken to make sure that person has no history of diseases nor engages in risky social behavior (examples are intravenous drug use or sexual activity with multiple partners). The donor's travel history is screened to minimize risk of transmitting infections, such as malaria. The donated blood is tested for signs of infectious diseases, such as HIV and hepatitis. The blood is then tested to be sure it is compatible with you in order to minimize the  chance of a transfusion reaction. If you or a relative donates blood, this is often done in anticipation of surgery and is not appropriate for emergency situations. It takes many days to process the donated blood. RISKS AND COMPLICATIONS Although transfusion therapy is very safe and saves many lives, the main dangers of transfusion include:   Getting an infectious disease.  Developing a transfusion reaction. This is an allergic reaction to something in the blood you were given. Every precaution is taken to prevent this. The decision to have a blood transfusion has been considered carefully by your caregiver before blood is given. Blood is not given unless the benefits outweigh the risks. AFTER THE TRANSFUSION  Right after receiving a blood transfusion, you will usually feel much better and more energetic. This is especially true if your red blood cells have gotten low (anemic). The transfusion raises the level of the red blood cells which carry oxygen, and this usually causes an energy increase.  The nurse administering the transfusion will monitor you carefully for complications.  HOME CARE INSTRUCTIONS  No special instructions are needed after a transfusion. You may find your energy is better. Speak with your caregiver about any limitations on activity for underlying diseases you may have. SEEK MEDICAL CARE IF:   Your condition is not improving after your transfusion.  You develop redness or irritation at the intravenous (IV) site. SEEK IMMEDIATE MEDICAL CARE IF:  Any of the following symptoms occur over the next 12 hours:  Shaking chills.  You have a temperature by mouth above 102 F (38.9 C), not controlled by medicine.  Chest, back, or muscle pain.  People around you feel you are not acting correctly or are confused.  Shortness of breath or difficulty breathing.  Dizziness and fainting.  You get a rash or develop hives.  You have a decrease in urine output.  Your urine  turns a dark color or changes to pink, red, or brown. Any of the following symptoms occur over the next 10 days:  You have a temperature by mouth above 102 F (38.9 C), not controlled by medicine.  Shortness of breath.  Weakness after normal activity.  The white part of the eye turns yellow (jaundice).  You have a decrease in the amount of urine or are urinating less often.  Your urine turns a dark color or changes to pink, red, or brown. Document Released: 06/17/2000 Document Revised: 09/12/2011 Document Reviewed: 02/04/2008 [facility] Patient Information 2014 [place], [place].  _______________________________________________________________________

## 2013-11-06 ENCOUNTER — Encounter (HOSPITAL_COMMUNITY)
Admission: RE | Admit: 2013-11-06 | Discharge: 2013-11-06 | Disposition: A | Discharge status: Home / Self Care | Payer: 59 | Source: Ambulatory Visit | Attending: Urology | Admitting: Urology

## 2013-11-06 ENCOUNTER — Encounter (HOSPITAL_COMMUNITY): Payer: Self-pay

## 2013-11-06 DIAGNOSIS — Z01812 Encounter for preprocedural laboratory examination: Secondary | ICD-10-CM | POA: Insufficient documentation

## 2013-11-06 HISTORY — DX: Malignant (primary) neoplasm, unspecified: C80.1

## 2013-11-06 HISTORY — DX: Allergy status to unspecified drugs, medicaments and biological substances: Z88.9

## 2013-11-06 LAB — BASIC METABOLIC PANEL
BUN: 15 mg/dL (ref 6–23)
CALCIUM: 9.3 mg/dL (ref 8.4–10.5)
CO2: 28 mEq/L (ref 19–32)
CREATININE: 1.25 mg/dL (ref 0.50–1.35)
Chloride: 103 mEq/L (ref 96–112)
GFR calc Af Amer: 79 mL/min — ABNORMAL LOW (ref >90)
GFR calc non Af Amer: 69 mL/min — ABNORMAL LOW (ref >90)
GLUCOSE: 89 mg/dL (ref 70–99)
Potassium: 4.6 mEq/L (ref 3.7–5.3)
Sodium: 140 mEq/L (ref 137–147)

## 2013-11-06 LAB — CBC
HCT: 38.4 % — ABNORMAL LOW (ref 39.0–52.0)
Hemoglobin: 12.2 g/dL — ABNORMAL LOW (ref 13.0–17.0)
MCH: 27.5 pg (ref 26.0–34.0)
MCHC: 31.8 g/dL (ref 30.0–36.0)
MCV: 86.7 fL (ref 78.0–100.0)
PLATELETS: 211 10*3/uL (ref 150–400)
RBC: 4.43 MIL/uL (ref 4.22–5.81)
RDW: 14.5 % (ref 11.5–15.5)
WBC: 7.3 10*3/uL (ref 4.0–10.5)

## 2013-11-06 LAB — ABO/RH: ABO/RH(D): O POS

## 2013-11-12 NOTE — H&P (Signed)
Reason For Visit Here today for robotic prostatectomy for management of favorable risk prostate cancer   History of Present Illness    Prostate cancer Hx:     Harold Perez was noted to have a PSA of approximately 8. Digital rectal exam showed a small prostate without worrisome features. He does have a family history of prostate cancer. He's had no significant voiding issues. Ultrasound failed to show any hypoechoic areas or other worrisome features. Prostate volume is around 20 g. Unfortunately prostate cancer was found in 2 out of 6 biopsies on the right. Spelled for a Gleason's 3+3 equal 6 cancer with 5-20% core involvement. All left-sided biopsies were negative for adenocarcinoma. 2 additional cores on the right and 2 on the left showed either atypia or high-grade.       IPSS 7/1   SHIM 25/25   Past Medical History Problems  1. History of gout (V12.29) 2. History of heartburn (V12.79)  Surgical History Problems  1. Denied: History Of Prior Surgery  Current Meds 1. No Reported Medications Recorded  Allergies Medication  1. No Known Drug Allergies  Family History Problems  1. Family history of diabetes mellitus (V18.0) : Mother 2. Family history of malignant neoplasm of breast (V16.3) : Mother 3. Family history of prostate cancer (D66.44) : Father 4. Family history of Hematuria : Father  Social History Problems  1. Denied: History of Alcohol use 2. Denied: History of Caffeine use 3. Father deceased   30yrs, pca 4. Married 5. Mother deceased   61yrs, Breast ca 6. Never smoker 7. Occupation   Associate Professor 8. Three children  Review of Systems Genitourinary, constitutional, skin, eye, otolaryngeal, hematologic/lymphatic, cardiovascular, pulmonary, endocrine, musculoskeletal, gastrointestinal, neurological and psychiatric system(s) were reviewed and pertinent findings if present are noted.  Genitourinary: urinary frequency.  Respiratory: cough.   Musculoskeletal: joint pain.    Vitals Vital Signs [Data Includes: Last 1 Day]   Blood Pressure: 144 / 84 Temperature: 98.6 F Heart Rate: 65  Physical Exam Constitutional: Well nourished and well developed . No acute distress.  ENT:. The ears and nose are normal in appearance.  Neck: The appearance of the neck is normal and no neck mass is present.  Pulmonary: No respiratory distress and normal respiratory rhythm and effort.  Cardiovascular: Heart rate and rhythm are normal . No peripheral edema.  Abdomen: The abdomen is soft and nontender. No masses are palpated. No CVA tenderness. No hernias are palpable. No hepatosplenomegaly noted.  Rectal: Rectal exam demonstrates normal sphincter tone, no tenderness and no masses. Prostate size is estimated to be 20 g. Normal rectal tone, no rectal masses, prostate is smooth, symmetric and non-tender. The prostate has no nodularity and is not tender. The left seminal vesicle is nonpalpable. The right seminal vesicle is nonpalpable. The perineum is normal on inspection.  Genitourinary: Examination of the penis demonstrates no discharge, no masses, no lesions and a normal meatus. The scrotum is without lesions. The right epididymis is palpably normal and non-tender. The left epididymis is palpably normal and non-tender. The right testis is non-tender and without masses. The left testis is non-tender and without masses.  Lymphatics: The femoral and inguinal nodes are not enlarged or tender.  Skin: Normal skin turgor, no visible rash and no visible skin lesions.  Neuro/Psych:. Mood and affect are appropriate.    Results/Data Urine [Data Includes: Last 1 Day]   [id number] COLOR YELLOW  APPEARANCE CLEAR  SPECIFIC GRAVITY 1.025  pH 6.0  GLUCOSE NEG mg/dL BILIRUBIN NEG  KETONE NEG mg/dL BLOOD TRACE  PROTEIN NEG mg/dL UROBILINOGEN 0.2 mg/dL NITRITE NEG  LEUKOCYTE ESTERASE NEG  SQUAMOUS EPITHELIAL/HPF NONE SEEN  WBC NONE SEEN WBC/hpf RBC 3-6  RBC/hpf BACTERIA NONE SEEN  CRYSTALS NONE SEEN  CASTS NONE SEEN   Assessment Assessed  1. Prostate cancer (185)  Plan Health Maintenance  1. UA With REFLEX; [Do Not Release]; Status:Complete;   Done: [id number] 04:05PM Prostate cancer  2. Follow-up Schedule Surgery Office  Follow-up  Status: Hold For - Appointment   Requested for: [phone]  Discussion/Summary  The patient was counseled about the natural history of prostate cancer and the standard treatment options that are available for prostate cancer. It was explained to him how his age and life expectancy, clinical stage, Gleason score, and PSA affect his prognosis, the decision to proceed with additional staging studies, as well as how that information influences recommended treatment strategies. We discussed the roles for active surveillance, radiation therapy, surgical therapy, androgen deprivation, as well as ablative therapy options for the treatment of prostate cancer as appropriate to his individual cancer situation. We discussed the risks and benefits of these options with regard to their impact on cancer control and also in terms of potential adverse events, complications, and impact on quiality of life particularly related to urinary, bowel, and sexual function. The patient was encouraged to ask questions throughout the discussion today and all questions were answered to his stated satisfaction. In addition, the patient was provided with and/or directed to appropriate resources and literature for further education about prostate cancer and treatment options.   We discussed surgical therapy for prostate cancer including the different available surgical approaches. We discussed, in detail, the risks and expectations of surgery with regard to cancer control, urinary control, and erectile function as well as the expected postoperative recovery process. The risks, potential complications/adverse events of radical prostatectomy as well as  alternative options were explained to the patient.   We discussed surgical therapy for prostate cancer including the different available surgical approaches. We discussed, in detail, the risks and expectations of surgery with regard to cancer control, urinary control, and erectile function as well as the expected postoperative recovery process. Additional risks of surgery including but not limited to bleeding, infection, hernia formation, nerve damage, lymphocele formation, bowel/rectal injury potentially necessitating colostomy, damage to the urinary tract resulting in urine leakage, urethral stricture, and the cardiopulmonary risks such as myocardial infarction, stroke, death, venothromboembolism, etc. were explained. The risk of open surgical conversion for robotic/laparoscopic prostatectomy was also discussed.   25 minutes were spent in face to face consultation with patient today.   Wants Lap/ Robot surgery. Bilateral nerve spare. No PLND.

## 2013-11-12 NOTE — Anesthesia Preprocedure Evaluation (Signed)

## 2013-11-13 ENCOUNTER — Inpatient Hospital Stay (HOSPITAL_COMMUNITY)
Admission: RE | Admit: 2013-11-13 | Discharge: 2013-11-14 | DRG: 708 | Disposition: A | Discharge status: Home / Self Care | Payer: 59 | Source: Ambulatory Visit | Attending: Urology | Admitting: Urology

## 2013-11-13 ENCOUNTER — Encounter (HOSPITAL_COMMUNITY): Payer: 59 | Admitting: Anesthesiology

## 2013-11-13 ENCOUNTER — Inpatient Hospital Stay (HOSPITAL_COMMUNITY): Payer: 59 | Admitting: Anesthesiology

## 2013-11-13 ENCOUNTER — Encounter (HOSPITAL_COMMUNITY): Payer: Self-pay | Admitting: *Deleted

## 2013-11-13 ENCOUNTER — Encounter (HOSPITAL_COMMUNITY)
Admission: RE | Disposition: A | Discharge status: Home / Self Care | Payer: Self-pay | Source: Ambulatory Visit | Attending: Urology

## 2013-11-13 DIAGNOSIS — M109 Gout, unspecified: Secondary | ICD-10-CM | POA: Diagnosis present

## 2013-11-13 DIAGNOSIS — Z833 Family history of diabetes mellitus: Secondary | ICD-10-CM

## 2013-11-13 DIAGNOSIS — Z01812 Encounter for preprocedural laboratory examination: Secondary | ICD-10-CM | POA: Diagnosis not present

## 2013-11-13 DIAGNOSIS — C61 Malignant neoplasm of prostate: Principal | ICD-10-CM | POA: Diagnosis present

## 2013-11-13 DIAGNOSIS — Z8042 Family history of malignant neoplasm of prostate: Secondary | ICD-10-CM | POA: Diagnosis not present

## 2013-11-13 HISTORY — PX: ROBOT ASSISTED LAPAROSCOPIC RADICAL PROSTATECTOMY: SHX5141

## 2013-11-13 LAB — HEMOGLOBIN AND HEMATOCRIT, BLOOD
HCT: 37.8 % — ABNORMAL LOW (ref 39.0–52.0)
Hemoglobin: 12.2 g/dL — ABNORMAL LOW (ref 13.0–17.0)

## 2013-11-13 LAB — TYPE AND SCREEN
ABO/RH(D): O POS
Antibody Screen: NEGATIVE

## 2013-11-13 SURGERY — ROBOTIC ASSISTED LAPAROSCOPIC RADICAL PROSTATECTOMY
Anesthesia: General

## 2013-11-13 MED ORDER — HYDROCODONE-ACETAMINOPHEN 5-325 MG PO TABS
1.0000 | ORAL_TABLET | ORAL | Status: DC | PRN
  Administered 2013-11-14: 2 via ORAL
  Filled 2013-11-13: qty 2

## 2013-11-13 MED ORDER — DEXAMETHASONE SODIUM PHOSPHATE 10 MG/ML IJ SOLN
INTRAMUSCULAR | Status: AC
  Filled 2013-11-13: qty 1

## 2013-11-13 MED ORDER — BUPIVACAINE-EPINEPHRINE 0.25% -1:200000 IJ SOLN
INTRAMUSCULAR | Status: DC | PRN
  Administered 2013-11-13: 35 mL

## 2013-11-13 MED ORDER — ACETAMINOPHEN 325 MG PO TABS: 650.0000 mg | ORAL_TABLET | ORAL | Status: DC | PRN

## 2013-11-13 MED ORDER — ONDANSETRON HCL 4 MG/2ML IJ SOLN
INTRAMUSCULAR | Status: AC
  Filled 2013-11-13: qty 2

## 2013-11-13 MED ORDER — HYDROMORPHONE HCL PF 2 MG/ML IJ SOLN
INTRAMUSCULAR | Status: AC
Start: 2013-11-13 — End: 2013-11-13
  Filled 2013-11-13: qty 1

## 2013-11-13 MED ORDER — HYDROMORPHONE HCL PF 1 MG/ML IJ SOLN
INTRAMUSCULAR | Status: DC | PRN
Start: 2013-11-13 — End: 2013-11-13
  Administered 2013-11-13 (×2): 1 mg via INTRAVENOUS

## 2013-11-13 MED ORDER — LIDOCAINE HCL (CARDIAC) 20 MG/ML IV SOLN
INTRAVENOUS | Status: DC | PRN
  Administered 2013-11-13: 100 mg via INTRAVENOUS

## 2013-11-13 MED ORDER — MIDAZOLAM HCL 2 MG/2ML IJ SOLN
INTRAMUSCULAR | Status: AC
  Filled 2013-11-13: qty 2

## 2013-11-13 MED ORDER — HYDROMORPHONE HCL PF 1 MG/ML IJ SOLN
INTRAMUSCULAR | Status: AC
  Filled 2013-11-13: qty 1

## 2013-11-13 MED ORDER — GLYCOPYRROLATE 0.2 MG/ML IJ SOLN
INTRAMUSCULAR | Status: AC
  Filled 2013-11-13: qty 4

## 2013-11-13 MED ORDER — CIPROFLOXACIN HCL 500 MG PO TABS: 500.0000 mg | ORAL_TABLET | Freq: Two times a day (BID) | ORAL | Status: DC

## 2013-11-13 MED ORDER — NEOSTIGMINE METHYLSULFATE 10 MG/10ML IV SOLN
INTRAVENOUS | Status: DC | PRN
  Administered 2013-11-13: 5 mg via INTRAVENOUS

## 2013-11-13 MED ORDER — STERILE WATER FOR IRRIGATION IR SOLN
Status: DC | PRN
  Administered 2013-11-13: 1500 mL

## 2013-11-13 MED ORDER — LACTATED RINGERS IV SOLN: INTRAVENOUS | Status: DC

## 2013-11-13 MED ORDER — SODIUM CHLORIDE 0.9 % IR SOLN
Status: DC | PRN
  Administered 2013-11-13: 300 mL via INTRAVESICAL

## 2013-11-13 MED ORDER — BUPIVACAINE-EPINEPHRINE 0.25% -1:200000 IJ SOLN
INTRAMUSCULAR | Status: AC
  Filled 2013-11-13: qty 1

## 2013-11-13 MED ORDER — ONDANSETRON HCL 4 MG/2ML IJ SOLN
INTRAMUSCULAR | Status: DC | PRN
  Administered 2013-11-13: 4 mg via INTRAVENOUS

## 2013-11-13 MED ORDER — SUCCINYLCHOLINE CHLORIDE 20 MG/ML IJ SOLN
INTRAMUSCULAR | Status: DC | PRN
  Administered 2013-11-13: 140 mg via INTRAVENOUS

## 2013-11-13 MED ORDER — KETOROLAC TROMETHAMINE 30 MG/ML IJ SOLN
INTRAMUSCULAR | Status: AC
  Filled 2013-11-13: qty 1

## 2013-11-13 MED ORDER — HYDROCODONE-ACETAMINOPHEN 5-325 MG PO TABS: 1.0000 | ORAL_TABLET | Freq: Four times a day (QID) | ORAL | Status: DC | PRN

## 2013-11-13 MED ORDER — NEOSTIGMINE METHYLSULFATE 10 MG/10ML IV SOLN
INTRAVENOUS | Status: AC
  Filled 2013-11-13: qty 1

## 2013-11-13 MED ORDER — PROPOFOL 10 MG/ML IV BOLUS
INTRAVENOUS | Status: AC
  Filled 2013-11-13: qty 20

## 2013-11-13 MED ORDER — HEPARIN SODIUM (PORCINE) 1000 UNIT/ML IJ SOLN
INTRAMUSCULAR | Status: AC
  Filled 2013-11-13: qty 1

## 2013-11-13 MED ORDER — GLYCOPYRROLATE 0.2 MG/ML IJ SOLN
INTRAMUSCULAR | Status: DC | PRN
  Administered 2013-11-13: .8 mg via INTRAVENOUS

## 2013-11-13 MED ORDER — HEMOSTATIC AGENTS (NO CHARGE) OPTIME
TOPICAL | Status: DC | PRN
  Administered 2013-11-13: 1 via TOPICAL

## 2013-11-13 MED ORDER — LIDOCAINE HCL (CARDIAC) 20 MG/ML IV SOLN
INTRAVENOUS | Status: AC
  Filled 2013-11-13: qty 5

## 2013-11-13 MED ORDER — FENTANYL CITRATE 0.05 MG/ML IJ SOLN
INTRAMUSCULAR | Status: DC | PRN
  Administered 2013-11-13 (×2): 50 ug via INTRAVENOUS
  Administered 2013-11-13: 100 ug via INTRAVENOUS
  Administered 2013-11-13: 50 ug via INTRAVENOUS

## 2013-11-13 MED ORDER — ROCURONIUM BROMIDE 100 MG/10ML IV SOLN
INTRAVENOUS | Status: DC | PRN
  Administered 2013-11-13: 15 mg via INTRAVENOUS
  Administered 2013-11-13: 45 mg via INTRAVENOUS
  Administered 2013-11-13 (×3): 5 mg via INTRAVENOUS
  Administered 2013-11-13: 10 mg via INTRAVENOUS

## 2013-11-13 MED ORDER — KETOROLAC TROMETHAMINE 30 MG/ML IJ SOLN
30.0000 mg | Freq: Four times a day (QID) | INTRAMUSCULAR | Status: DC
  Filled 2013-11-13 (×4): qty 1
  Filled 2013-11-13: qty 2

## 2013-11-13 MED ORDER — MIDAZOLAM HCL 5 MG/5ML IJ SOLN
INTRAMUSCULAR | Status: DC | PRN
  Administered 2013-11-13: 2 mg via INTRAVENOUS

## 2013-11-13 MED ORDER — MORPHINE SULFATE 2 MG/ML IJ SOLN
2.0000 mg | INTRAMUSCULAR | Status: DC | PRN
  Administered 2013-11-13 (×2): 2 mg via INTRAVENOUS
  Filled 2013-11-13 (×2): qty 1

## 2013-11-13 MED ORDER — PROMETHAZINE HCL 25 MG/ML IJ SOLN: 6.2500 mg | INTRAMUSCULAR | Status: DC | PRN

## 2013-11-13 MED ORDER — CEFAZOLIN SODIUM-DEXTROSE 2-3 GM-% IV SOLR
INTRAVENOUS | Status: AC
  Filled 2013-11-13: qty 50

## 2013-11-13 MED ORDER — LACTATED RINGERS IV SOLN
INTRAVENOUS | Status: DC | PRN
  Administered 2013-11-13 (×2): via INTRAVENOUS

## 2013-11-13 MED ORDER — DEXAMETHASONE SODIUM PHOSPHATE 10 MG/ML IJ SOLN
INTRAMUSCULAR | Status: DC | PRN
  Administered 2013-11-13: 10 mg via INTRAVENOUS

## 2013-11-13 MED ORDER — SODIUM CHLORIDE 0.9 % IV BOLUS (SEPSIS)
1000.0000 mL | Freq: Once | INTRAVENOUS | Status: AC
  Administered 2013-11-13: 1000 mL via INTRAVENOUS

## 2013-11-13 MED ORDER — DEXTROSE-NACL 5-0.45 % IV SOLN
INTRAVENOUS | Status: DC
  Administered 2013-11-13 – 2013-11-14 (×4): via INTRAVENOUS

## 2013-11-13 MED ORDER — HYDROMORPHONE HCL PF 1 MG/ML IJ SOLN: 0.2500 mg | INTRAMUSCULAR | Status: DC | PRN

## 2013-11-13 MED ORDER — KETOROLAC TROMETHAMINE 30 MG/ML IJ SOLN
15.0000 mg | Freq: Once | INTRAMUSCULAR | Status: AC | PRN
  Administered 2013-11-13: 30 mg via INTRAVENOUS

## 2013-11-13 MED ORDER — KETOROLAC TROMETHAMINE 30 MG/ML IJ SOLN
30.0000 mg | Freq: Four times a day (QID) | INTRAMUSCULAR | Status: DC
  Administered 2013-11-13 – 2013-11-14 (×3): 30 mg via INTRAVENOUS
  Filled 2013-11-13 (×6): qty 1
  Filled 2013-11-13: qty 2
  Filled 2013-11-13: qty 1
  Filled 2013-11-13: qty 2

## 2013-11-13 MED ORDER — PROPOFOL 10 MG/ML IV BOLUS
INTRAVENOUS | Status: DC | PRN
  Administered 2013-11-13: 200 mg via INTRAVENOUS

## 2013-11-13 MED ORDER — FENTANYL CITRATE 0.05 MG/ML IJ SOLN
INTRAMUSCULAR | Status: AC
  Filled 2013-11-13: qty 5

## 2013-11-13 MED ORDER — CEFAZOLIN SODIUM-DEXTROSE 2-3 GM-% IV SOLR
2.0000 g | INTRAVENOUS | Status: AC
  Administered 2013-11-13: 2 g via INTRAVENOUS

## 2013-11-13 MED ORDER — ROCURONIUM BROMIDE 100 MG/10ML IV SOLN
INTRAVENOUS | Status: AC
  Filled 2013-11-13: qty 1

## 2013-11-13 SURGICAL SUPPLY — 46 items
APPLICATOR SURGIFLO ENDO (HEMOSTASIS) ×2 IMPLANT
CABLE HIGH FREQUENCY MONO STRZ (ELECTRODE) ×2 IMPLANT
CATH FOLEY 2WAY SLVR 18FR 30CC (CATHETERS) ×2 IMPLANT
CATH ROBINSON RED A/P 16FR (CATHETERS) ×2 IMPLANT
CATH TIEMANN FOLEY 18FR 5CC (CATHETERS) ×2 IMPLANT
CHLORAPREP W/TINT 26ML (MISCELLANEOUS) ×2 IMPLANT
CLIP LIGATING HEM O LOK PURPLE (MISCELLANEOUS) IMPLANT
CLOTH BEACON ORANGE TIMEOUT ST (SAFETY) ×2 IMPLANT
COVER SURGICAL LIGHT HANDLE (MISCELLANEOUS) ×2 IMPLANT
COVER TIP SHEARS 8 DVNC (MISCELLANEOUS) ×1 IMPLANT
COVER TIP SHEARS 8MM DA VINCI (MISCELLANEOUS) ×1
CUTTER ECHEON FLEX ENDO 45 340 (ENDOMECHANICALS) ×2 IMPLANT
DECANTER SPIKE VIAL GLASS SM (MISCELLANEOUS) ×2 IMPLANT
DRAPE SURG IRRIG POUCH 19X23 (DRAPES) ×2 IMPLANT
DRSG TEGADERM 2-3/8X2-3/4 SM (GAUZE/BANDAGES/DRESSINGS) ×8 IMPLANT
DRSG TEGADERM 4X4.75 (GAUZE/BANDAGES/DRESSINGS) ×4 IMPLANT
DRSG TEGADERM 6X8 (GAUZE/BANDAGES/DRESSINGS) ×4 IMPLANT
ELECT REM PT RETURN 9FT ADLT (ELECTROSURGICAL) ×2
ELECTRODE REM PT RTRN 9FT ADLT (ELECTROSURGICAL) ×1 IMPLANT
GAUZE SPONGE 2X2 8PLY STRL LF (GAUZE/BANDAGES/DRESSINGS) IMPLANT
GLOVE BIO SURGEON STRL SZ 6.5 (GLOVE) ×6 IMPLANT
GLOVE BIOGEL M STRL SZ7.5 (GLOVE) ×4 IMPLANT
GOWN STRL REUS W/TWL LRG LVL3 (GOWN DISPOSABLE) ×6 IMPLANT
HOLDER FOLEY CATH W/STRAP (MISCELLANEOUS) ×2 IMPLANT
IV LACTATED RINGERS 1000ML (IV SOLUTION) ×2 IMPLANT
KIT ACCESSORY DA VINCI DISP (KITS) ×1
KIT ACCESSORY DVNC DISP (KITS) ×1 IMPLANT
NDL SAFETY ECLIPSE 18X1.5 (NEEDLE) ×1 IMPLANT
NEEDLE HYPO 18GX1.5 SHARP (NEEDLE) ×1
PACK ROBOT UROLOGY CUSTOM (CUSTOM PROCEDURE TRAY) ×2 IMPLANT
RELOAD GREEN ECHELON 45 (STAPLE) ×2 IMPLANT
SEALER TISSUE G2 CVD JAW 45CM (ENDOMECHANICALS) IMPLANT
SET TUBE IRRIG SUCTION NO TIP (IRRIGATION / IRRIGATOR) ×2 IMPLANT
SOLUTION ELECTROLUBE (MISCELLANEOUS) ×2 IMPLANT
SPONGE GAUZE 2X2 STER 10/PKG (GAUZE/BANDAGES/DRESSINGS)
SURGIFLO W/THROMBIN 8M KIT (HEMOSTASIS) ×2 IMPLANT
SUT ETHILON 3 0 PS 1 (SUTURE) ×2 IMPLANT
SUT VIC AB 2-0 SH 27 (SUTURE) ×1
SUT VIC AB 2-0 SH 27X BRD (SUTURE) ×1 IMPLANT
SUT VICRYL 0 UR6 27IN ABS (SUTURE) ×2 IMPLANT
SUT VLOC BARB 180 ABS3/0GR12 (SUTURE) ×6
SUTURE VLOC BRB 180 ABS3/0GR12 (SUTURE) ×3 IMPLANT
SYR 27GX1/2 1ML LL SAFETY (SYRINGE) ×2 IMPLANT
TOWEL OR 17X26 10 PK STRL BLUE (TOWEL DISPOSABLE) ×2 IMPLANT
TOWEL OR NON WOVEN STRL DISP B (DISPOSABLE) ×2 IMPLANT
WATER STERILE IRR 1500ML POUR (IV SOLUTION) ×4 IMPLANT

## 2013-11-13 NOTE — Progress Notes (Signed)
Patient ID: Harold Perez, male   DOB: 1970/08/29, 44 y.o.   MRN: [id number] Post-op note  Subjective: The patient is doing well.  No complaints.  Has not amb yet.  Low UO.  Objective: Vital signs in last 24 hours: Temp:  [97.7 F (36.5 C)-98.6 F (37 C)] 97.8 F (36.6 C) (05/13 1330) Pulse Rate:  [65-76] 74 (05/13 1330) Resp:  [10-18] 14 (05/13 1330) BP: (121-138)/(58-71) 123/64 mmHg (05/13 1330) SpO2:  [94 %-100 %] 95 % (05/13 1330) Weight:  [112.9 kg (248 lb 14.4 oz)] 112.9 kg (248 lb 14.4 oz) (05/13 1342)  Intake/Output from previous day:   Intake/Output this shift: Total I/O In: 3000 [I.V.:2000; IV Piggyback:1000] Out: 245 [Urine:150; Drains:20; Blood:75]  Physical Exam:  General: Alert and oriented. Abdomen: Soft, Nondistended. Incisions: Clean and dry. Urine: dark red  Lab Results:  Recent Labs  11/13/13 1202  HGB 12.2*  HCT 37.8*    Assessment/Plan: POD#0   1) Continue to monitor  2) Monitor UO.  Vitals stable.  Received bolus in PACU and IVF at 125/hr.  May need to re-bolus if UO does not pick up   3) Amb, IS, DVT prophy, clears, pain control     LOS: 0 days   Marcie Bal 11/13/2013, 3:40 PM

## 2013-11-13 NOTE — Discharge Instructions (Signed)
1. Activity:  You are encouraged to ambulate frequently (about every hour during waking hours) to help prevent blood clots from forming in your legs or lungs.  However, you should not engage in any heavy lifting (> 10-15 lbs), strenuous activity, or straining. 2. Diet: You should continue a clear liquid diet until passing gas from below.  Once this occurs, you may advance your diet to a soft diet that would be easy to digest (i.e soups, scrambled eggs, mashed potatoes, etc.) for 24 hours just as you would if getting over a bad stomach flu.  If tolerating this diet well for 24 hours, you may then begin eating regular food.  It will be normal to have some amount of bloating, nausea, and abdominal discomfort intermittently. 3. Prescriptions:  You will be provided a prescription for pain medication to take as needed.  If your pain is not severe enough to require the prescription pain medication, you may take Tylenol instead.  You should also take an over the counter stool softener (Colace 100 mg twice daily) to avoid straining with bowel movements as the pain medication may constipate you. Finally, you will also be provided a prescription for an antibiotic to begin the day prior to your return visit in the office for catheter removal. 4. Catheter care: You will be taught how to take care of the catheter by the nursing staff prior to discharge from the hospital.  You may use both a leg bag and the larger bedside bag but it is recommended to at least use the bigger bedside bag at nighttime as the leg bag is small and will fill up overnight and also does not drain as well when lying flat. You may periodically feel a strong urge to void with the catheter in place.  This is a bladder spasm and most often can occur when having a bowel movement or when you are moving around. It is typically self-limited and usually will stop after a few minutes.  You may use some Vaseline or Neosporin around the tip of the catheter to  reduce friction at the tip of the penis. 5. Incisions: You may remove your dressing bandages the 2nd day after surgery.  You most likely will have a few small staples in each of the incisions and once the bandages are removed, the incisions may stay open to air.  You may start showering (not soaking or bathing in water) 48 hours after surgery and the incisions simply need to be patted dry after the shower.  No additional care is needed. 6. What to call us about: You should call the office [phone]) if you develop fever > 101, persistent vomiting, or the catheter stops draining. Also, feel free to call with any other questions you may have and remember the handout that was provided to you as a reference preoperatively which answers many of the common questions that arise after surgery.  You may resume voltaren, aspirin, vitamins, supplements, advil, and aleve 7 days after surgery.

## 2013-11-13 NOTE — Anesthesia Postprocedure Evaluation (Signed)
  Anesthesia Post-op Note  Patient: Harold Perez  Procedure(s) Performed: Procedure(s) (LRB): ROBOTIC ASSISTED LAPAROSCOPIC RADICAL PROSTATECTOMY (N/A)  Patient Location: PACU  Anesthesia Type: General  Level of Consciousness: awake and alert   Airway and Oxygen Therapy: Patient Spontanous Breathing  Post-op Pain: mild  Post-op Assessment: Post-op Vital signs reviewed, Patient's Cardiovascular Status Stable, Respiratory Function Stable, Patent Airway and No signs of Nausea or vomiting  Last Vitals:  Filed Vitals:   11/13/13 1300  BP: 121/65  Pulse: 76  Temp: 36.5 C  Resp: 10    Post-op Vital Signs: stable   Complications: No apparent anesthesia complications

## 2013-11-13 NOTE — Interval H&P Note (Signed)
History and Physical Interval Note:  11/13/2013 8:17 AM  Lonell Grandchild  has presented today for surgery, with the diagnosis of PROSTATE CANCER  The various methods of treatment have been discussed with the patient and family. After consideration of risks, benefits and other options for treatment, the patient has consented to  Procedure(s): ROBOTIC [place] (N/A) as a surgical intervention .  The patient's history has been reviewed, patient examined, no change in status, stable for surgery.  I have reviewed the patient's chart and labs.  Questions were answered to the patient's satisfaction.     Bernestine Amass

## 2013-11-13 NOTE — Op Note (Signed)
Preoperative diagnosis: Clinical stage T1c Adenocarcinoma prostate  Postoperative diagnosis: Same  Procedure: Robotic-assisted laparoscopic radical retropubic prostatectomy Surgeon: Bernestine Amass, MD  Asst.: Leta Baptist, PA Anesthesia: Gen. Endotracheal  Indications: Patient was diagnosed with clinical stage TIc Adenocarcinoma the prostate. He underwent extensive consultation with regard to treatment options. The patient decided on a surgical approach. He appeared to understand the distinct advantages as well as the disadvantages of this procedure. The patient has performed a mechanical bowel prep. He has had placement of PAS compression boots and has received perioperative antibiotics. The patient's preoperative PSA was 8. Ultrasound revealed a 20 g prostate.   Technique and findings:The patient was brought to the operating room and had successful induction of general endotracheal anesthesia.the patient was placed in a low lithotomy position with careful padding of all extremities. He was secured to the operative table and placed in the steep Trendelenburg position. He was prepped and draped in usual manner. A Foley catheter was placed sterilely on the field. Camera port site was chosen 18 cm above the pubic symphysis just to the left of the umbilicus. A standard open Hassan technique was utilized. A 12 mm trocar was placed without difficulty. The camera was then inserted and no abnormalities were noted within the pelvis. The trochars were placed with direct visual guidance. This included 3 84mm robotic trochars and a 12 mm and 5 mm assist ports. Once all the ports were placed the robot was docked. The bladder was filled and the space of Retzius was developed with electrocautery dissection as well as blunt dissection. Superficial fat off the endopelvic fascia and bladder neck was removed with electrocautery scissors. The endopelvic fascia was then incised bilaterally from base to apex. Levator  musculature was swept off the apex of the prostate isolating the dorsal venous complex which was then stapled with the ETS stapling device. The anterior bladder neck was identified with the aid of the Foley balloon. This was then transected down to the Foley catheter with electrocautery scissors. The Foley catheter was then retracted anteriorly and we appeared to be well away from the ureteral orifices. The posterior bladder neck was then transected and the dissection carried down to the adnexal structures. The seminal vesicles and vas deferens on both sides were then individually dissected free and retracted anteriorly. The posterior plane between the rectum and prostate was then established primarily with blunt dissection.  Attention was then turned towards nerve sparing. The patient was felt to be a candidate for bilateral nerve sparing. Superficial fascia along the anterior lateral aspect of the prostate was incised bilaterally. This tissue was then swept laterally until we were able to establish a groove between the neurovascular tissue and the posterior lateral aspect on the prostate bilaterally. This groove was then extended from the apex back to the base of the prostate. With the prostate retracted anteriorly the vascular pedicles of the prostate were taken with the Hemolock clips. The Foley catheter was then reinserted and the anterior urethra was transected. The posterior urethra was then transected as were some rectourethralis fibers. The prostate was then removed from the pelvis. The pelvis was then copiously irrigated. Rectal insufflation was performed and there was no evidence of rectal injury.   Attention was then turned towards reconstruction. The bladder neck did not require any reconstruction. The bladder neck and posterior urethra were reapproximated at the 6:00 position utilizing a 3-0 V-lock suture. The rest of the anastomosis was done with a double-armed 3-0 V-lock suture in a  360 degree  manner. A new catheter was placed and bladder irrigation revealed no evidence of leakage. A Blake drain was placed through one of the robotic trochars and positioned in the retropubic space above the anastomosis. This was then secured to the skin with a nylon suture. The prostate was placed in the Endopouch retrieval bag. The 12 mm trocar site was closed with a Vicryl suture with the aid of a suture passer. Our other trochars were taken out with direct visual guidance without evidence of any bleeding. The camera port incision was extended slightly to allow for removal of the specimen and then closed with a running Vicryl suture. All port sites were infiltrated with Marcaine and then closed with surgical clips. The patient was then taken to recovery room having had no obvious complications or problems. Sponge and needle counts were correct.

## 2013-11-13 NOTE — Transfer of Care (Signed)
Immediate Anesthesia Transfer of Care Note  Patient: Harold Perez  Procedure(s) Performed: Procedure(s): ROBOTIC ASSISTED LAPAROSCOPIC RADICAL PROSTATECTOMY (N/A)  Patient Location: PACU  Anesthesia Type:General  Level of Consciousness: awake and alert   Airway & Oxygen Therapy: Patient Spontanous Breathing and Patient connected to face mask oxygen  Post-op Assessment: Report given to PACU RN and Post -op Vital signs reviewed and stable  Post vital signs: Reviewed and stable  Complications: No apparent anesthesia complications

## 2013-11-14 LAB — BASIC METABOLIC PANEL
BUN: 13 mg/dL (ref 6–23)
CALCIUM: 8.9 mg/dL (ref 8.4–10.5)
CO2: 25 mEq/L (ref 19–32)
Chloride: 100 mEq/L (ref 96–112)
Creatinine, Ser: 1.14 mg/dL (ref 0.50–1.35)
GFR calc Af Amer: 89 mL/min — ABNORMAL LOW (ref >90)
GFR, EST NON AFRICAN AMERICAN: 77 mL/min — AB (ref >90)
GLUCOSE: 130 mg/dL — AB (ref 70–99)
Potassium: 4.3 mEq/L (ref 3.7–5.3)
SODIUM: 136 meq/L — AB (ref 137–147)

## 2013-11-14 LAB — HEMOGLOBIN AND HEMATOCRIT, BLOOD
HEMATOCRIT: 34.8 % — AB (ref 39.0–52.0)
Hemoglobin: 11.5 g/dL — ABNORMAL LOW (ref 13.0–17.0)

## 2013-11-14 NOTE — Care Management Note (Signed)
    Page 1 of 1   11/14/2013     1:35:51 PM CARE MANAGEMENT NOTE 11/14/2013  Patient:  Harold Perez, Harold Perez   Account Number:  [id number]  Date Initiated:  11/14/2013  Documentation initiated by:  Dessa Phi  Subjective/Objective Assessment:   52 [place].     Action/Plan:   FROM HOME.   Anticipated DC Date:  11/14/2013   Anticipated DC Plan:  [place]  CM consult      Choice offered to / List presented to:             Status of service:  Completed, signed off Medicare Important Message given?   (If response is "NO", the following Medicare IM given date fields will be blank) Date Medicare IM given:   Date Additional Medicare IM given:    Discharge Disposition:  HOME/SELF CARE  Per UR Regulation:  Reviewed for med. necessity/level of care/duration of stay  If discussed at [place] of Stay Meetings, dates discussed:    Comments:  11/14/13 Jeanann Balinski RN,BSN NCM [phone] D/C HOME NO [place].

## 2013-11-14 NOTE — Progress Notes (Signed)
Completed foley and leg bag teaching with patient and patient's wife.  Patient and wife verbalized understanding.  Writer discussed with patient and wife diet, activity level, and reinforced the importance of using incentive spirometer.  Patient and wife verbalized understanding.  Teach back method used.

## 2013-11-14 NOTE — Discharge Summary (Signed)
Patient ID: Harold Perez MRN: [id number] DOB/AGE: 1970/11/21 43 y.o.  Admit date: 11/13/2013 Discharge date: 11/14/2013    Discharge Diagnoses:   Present on Admission:  . Malignant neoplasm of prostate  Consults:  None    Discharge Medications:   Medication List    STOP taking these medications       cholecalciferol 1000 UNITS tablet  Commonly known as:  VITAMIN D     diclofenac 50 MG EC tablet  Commonly known as:  VOLTAREN     multivitamin tablet     polycarbophil 625 MG tablet  Commonly known as:  FIBERCON      TAKE these medications       ciprofloxacin 500 MG tablet  Commonly known as:  CIPRO  Take 1 tablet (500 mg total) by mouth 2 (two) times daily. Start day prior to office visit for foley removal     HYDROcodone-acetaminophen 5-325 MG per tablet  Commonly known as:  NORCO  Take 1-2 tablets by mouth every 6 (six) hours as needed.     omega-3 acid ethyl esters 1 G capsule  Commonly known as:  LOVAZA  Take 1 g by mouth daily.         Significant Diagnostic Studies:  No results found.    Hospital Course:  Active Problems:   Malignant neoplasm of prostate  Patient underwent uneventful robotic-assisted laparoscopic radical retropubic prostatectomy. At completion of surgery the patient was admitted for overnight observation. He had no obvious postoperative complications. He was able to ambulate. He tolerated by mouth. Lab work was acceptable. Jackson-Pratt drainage was minimal and urinary output was excellent. The patient's drain was removed on postoperative day 1. He was given routine postoperative instructions and then discharge. His pathology remains pending. Day of Discharge BP 107/56  Pulse 62  Temp(Src) 98.4 F (36.9 C) (Oral)  Resp 18  Ht 6\' 2"  (1.88 m)  Wt 248 lb 14.4 oz (112.9 kg)  BMI 31.94 kg/m2  SpO2 97%  Well-developed well-nourished male in no acute distress Respiratory: Normal effort Cardiac: Regular rate and rhythm Abdomen:  Soft incisions dry Extremities: No edema or tenderness  Results for orders placed during the hospital encounter of 11/13/13 (from the past 24 hour(s))  BASIC METABOLIC PANEL     Status: Abnormal   Collection Time    11/14/13  3:50 AM      Result Value Ref Range   Sodium 136 (*) 137 - 147 mEq/L   Potassium 4.3  3.7 - 5.3 mEq/L   Chloride 100  96 - 112 mEq/L   CO2 25  19 - 32 mEq/L   Glucose, Bld 130 (*) 70 - 99 mg/dL   BUN 13  6 - 23 mg/dL   Creatinine, Ser 1.14  0.50 - 1.35 mg/dL   Calcium 8.9  8.4 - 10.5 mg/dL   GFR calc non Af Amer 77 (*) >90 mL/min   GFR calc Af Amer 89 (*) >90 mL/min  HEMOGLOBIN AND HEMATOCRIT, BLOOD     Status: Abnormal   Collection Time    11/14/13  3:50 AM      Result Value Ref Range   Hemoglobin 11.5 (*) 13.0 - 17.0 g/dL   HCT 34.8 (*) 39.0 - 52.0 %

## 2013-11-14 NOTE — Progress Notes (Signed)
1 Day Post-Op Subjective: The patient is doing well.  No nausea or vomiting. Pain is adequately controlled. Positive flatus. Has ambulated.  Objective: Vital signs in last 24 hours: Temp:  [97.7 F (36.5 C)-98.6 F (37 C)] 98.4 F (36.9 C) (05/14 0623) Pulse Rate:  [62-76] 62 (05/14 0623) Resp:  [10-20] 18 (05/14 0623) BP: (104-138)/(56-65) 107/56 mmHg (05/14 0623) SpO2:  [94 %-100 %] 97 % (05/14 0623) Weight:  [248 lb 14.4 oz (112.9 kg)] 248 lb 14.4 oz (112.9 kg) (05/13 1342)  Intake/Output from previous day: 05/13 0701 - 05/14 0700 In: 5500 [I.V.:4500; IV Piggyback:1000] Out: 3405 [Urine:3250; Drains:80; Blood:75] Intake/Output this shift:    Physical Exam:  General: Alert and oriented. CV: RRR Lungs: Normal effort. GI: Soft, Nondistended. Incisions: Dressings intact. Urine: light pink Extremities: Nontender, no erythema, no edema.  Lab Results:  Recent Labs  11/13/13 1202 11/14/13 0350  HGB 12.2* 11.5*  HCT 37.8* 34.8*    Assessment/Plan: POD# 1 s/p robotic prostatectomy.  1) SL IVF 2) Ambulate, Incentive spirometry 3) Transition to oral pain medication 4) Dulcolax suppository 5) D/C pelvic drain 6) Plan for discharge later today   Bernestine Amass, MD

## 2013-11-15 ENCOUNTER — Encounter (HOSPITAL_COMMUNITY): Payer: Self-pay | Admitting: Urology

## 2015-05-26 ENCOUNTER — Emergency Department (HOSPITAL_BASED_OUTPATIENT_CLINIC_OR_DEPARTMENT_OTHER)
Admission: EM | Admit: 2015-05-26 | Discharge: 2015-05-26 | Disposition: A | Discharge status: Home / Self Care | Payer: PRIVATE HEALTH INSURANCE | Attending: Emergency Medicine | Admitting: Emergency Medicine

## 2015-05-26 ENCOUNTER — Other Ambulatory Visit: Payer: Self-pay

## 2015-05-26 ENCOUNTER — Emergency Department (HOSPITAL_BASED_OUTPATIENT_CLINIC_OR_DEPARTMENT_OTHER): Payer: PRIVATE HEALTH INSURANCE

## 2015-05-26 ENCOUNTER — Encounter (HOSPITAL_BASED_OUTPATIENT_CLINIC_OR_DEPARTMENT_OTHER): Payer: Self-pay

## 2015-05-26 DIAGNOSIS — Z8719 Personal history of other diseases of the digestive system: Secondary | ICD-10-CM | POA: Diagnosis not present

## 2015-05-26 DIAGNOSIS — Z8546 Personal history of malignant neoplasm of prostate: Secondary | ICD-10-CM | POA: Insufficient documentation

## 2015-05-26 DIAGNOSIS — R0789 Other chest pain: Secondary | ICD-10-CM | POA: Diagnosis not present

## 2015-05-26 DIAGNOSIS — R079 Chest pain, unspecified: Secondary | ICD-10-CM | POA: Diagnosis present

## 2015-05-26 LAB — COMPREHENSIVE METABOLIC PANEL
ALK PHOS: 89 U/L (ref 38–126)
ALT: 23 U/L (ref 17–63)
ANION GAP: 6 (ref 5–15)
AST: 27 U/L (ref 15–41)
Albumin: 4.3 g/dL (ref 3.5–5.0)
BILIRUBIN TOTAL: 0.9 mg/dL (ref 0.3–1.2)
BUN: 18 mg/dL (ref 6–20)
CALCIUM: 9.4 mg/dL (ref 8.9–10.3)
CO2: 27 mmol/L (ref 22–32)
CREATININE: 1.21 mg/dL (ref 0.61–1.24)
Chloride: 104 mmol/L (ref 101–111)
GFR calc non Af Amer: >60 mL/min (ref >60)
Glucose, Bld: 93 mg/dL (ref 65–99)
POTASSIUM: 3.8 mmol/L (ref 3.5–5.1)
Sodium: 137 mmol/L (ref 135–145)
TOTAL PROTEIN: 8.6 g/dL — AB (ref 6.5–8.1)

## 2015-05-26 LAB — TROPONIN I: Troponin I: <0.03 ng/mL (ref <0.031)

## 2015-05-26 LAB — CBC
HCT: 39 % (ref 39.0–52.0)
HEMOGLOBIN: 12.8 g/dL — AB (ref 13.0–17.0)
MCH: 27.8 pg (ref 26.0–34.0)
MCHC: 32.8 g/dL (ref 30.0–36.0)
MCV: 84.8 fL (ref 78.0–100.0)
Platelets: 196 10*3/uL (ref 150–400)
RBC: 4.6 MIL/uL (ref 4.22–5.81)
RDW: 14.2 % (ref 11.5–15.5)
WBC: 7.4 10*3/uL (ref 4.0–10.5)

## 2015-05-26 NOTE — ED Notes (Signed)
Drew 2nd troponin. Pt requesting to leave.  Informed he we needed to wait on 2nd marker.  Verbalized understanding.

## 2015-05-26 NOTE — ED Provider Notes (Signed)
CSN: [id number]     Arrival date & time 05/26/15  1137 History   First MD Initiated Contact with Patient 05/26/15 1148     Chief Complaint  Patient presents with  . Chest Pain     (Consider location/radiation/quality/duration/timing/severity/associated sxs/prior Treatment) Patient is a 44 y.o. male presenting with chest pain. The history is provided by the patient.  Chest Pain Associated symptoms: no abdominal pain, no back pain, no cough, no fever, no headache, no shortness of breath and not vomiting   Patient indicates acute onset left chest pain at work today.  Pt indicates was lifting a couple boxes, and felt a sudden sharp pain that lasted approximately 30 - 40 seconds, and shot down left arm.  Pain resolved. Denies any more prolonged chest pain or discomfort. No associated nv, diaphoresis or sob. No persistent and/or pleuritic pain.  No recent exertional chest pain whether w exercise, walking or going up steps. No cough or uri c/o. No fevers or chills. No leg pain or swelling. Non smoker. No hx htn or diabetes. No fam hx cad. No recent surgery, trauma, immobility to trauma.      Past Medical History  Diagnosis Date  . GERD (gastroesophageal reflux disease)   . Cancer [facility])     prostate  . H/O seasonal allergies    Past Surgical History  Procedure Laterality Date  . No past surgeries    . Robot assisted laparoscopic radical prostatectomy N/A 11/13/2013    Procedure: ROBOTIC ASSISTED LAPAROSCOPIC RADICAL PROSTATECTOMY;  Surgeon: Bernestine Amass, MD;  Location: [facility]S;  Service: Urology;  Laterality: N/A;   Family History  Problem Relation Age of Onset  . Hypertension Mother   . Diabetes Mother   . Cancer Mother     breast  . Cancer Father     prostate  . Hypertension Father    Social History  Substance Use Topics  . Smoking status: Never Smoker   . Smokeless tobacco: Never Used  . Alcohol Use: No    Review of Systems  Constitutional: Negative for fever and chills.   HENT: Negative for sore throat.   Eyes: Negative for redness.  Respiratory: Negative for cough and shortness of breath.   Cardiovascular: Positive for chest pain. Negative for leg swelling.  Gastrointestinal: Negative for vomiting, abdominal pain and diarrhea.  Genitourinary: Negative for flank pain.  Musculoskeletal: Negative for back pain and neck pain.  Skin: Negative for rash.  Neurological: Negative for headaches.  Hematological: Does not bruise/bleed easily.  Psychiatric/Behavioral: Negative for confusion.      Allergies  Review of patient's allergies indicates no known allergies.  Home Medications   Prior to Admission medications   Not on File   BP 137/93 mmHg  Pulse 62  Temp(Src) 98.1 F (36.7 C) (Oral)  Resp 18  Ht 6\' 3"  (1.905 m)  Wt 111.131 kg  BMI 30.62 kg/m2  SpO2 100% Physical Exam  Constitutional: He is oriented to person, place, and time. He appears well-developed and well-nourished. No distress.  HENT:  Head: Atraumatic.  Mouth/Throat: Oropharynx is clear and moist.  Eyes: Conjunctivae are normal. No scleral icterus.  Neck: Neck supple. No tracheal deviation present.  Cardiovascular: Normal rate, regular rhythm, normal heart sounds and intact distal pulses.  Exam reveals no gallop and no friction rub.   No murmur heard. Pulmonary/Chest: Effort normal and breath sounds normal. No accessory muscle usage. No respiratory distress. He exhibits no tenderness.  Abdominal: Soft. Bowel sounds are normal. He  exhibits no distension. There is no tenderness.  Musculoskeletal: Normal range of motion. He exhibits no edema or tenderness.  Neurological: He is alert and oriented to person, place, and time.  Skin: Skin is warm and dry. No rash noted. He is not diaphoretic.  Psychiatric: He has a normal mood and affect.  Nursing note and vitals reviewed.   ED Course  Procedures (including critical care time) Labs Review   Results for orders placed or performed  during the hospital encounter of 05/26/15  CBC  Result Value Ref Range   WBC 7.4 4.0 - 10.5 K/uL   RBC 4.60 4.22 - 5.81 MIL/uL   Hemoglobin 12.8 (L) 13.0 - 17.0 g/dL   HCT 39.0 39.0 - 52.0 %   MCV 84.8 78.0 - 100.0 fL   MCH 27.8 26.0 - 34.0 pg   MCHC 32.8 30.0 - 36.0 g/dL   RDW 14.2 11.5 - 15.5 %   Platelets 196 150 - 400 K/uL  Comprehensive metabolic panel  Result Value Ref Range   Sodium 137 135 - 145 mmol/L   Potassium 3.8 3.5 - 5.1 mmol/L   Chloride 104 101 - 111 mmol/L   CO2 27 22 - 32 mmol/L   Glucose, Bld 93 65 - 99 mg/dL   BUN 18 6 - 20 mg/dL   Creatinine, Ser 1.21 0.61 - 1.24 mg/dL   Calcium 9.4 8.9 - 10.3 mg/dL   Total Protein 8.6 (H) 6.5 - 8.1 g/dL   Albumin 4.3 3.5 - 5.0 g/dL   AST 27 15 - 41 U/L   ALT 23 17 - 63 U/L   Alkaline Phosphatase 89 38 - 126 U/L   Total Bilirubin 0.9 0.3 - 1.2 mg/dL   GFR calc non Af Amer >60 >60 mL/min   GFR calc Af Amer >60 >60 mL/min   Anion gap 6 5 - 15  Troponin I  Result Value Ref Range   Troponin I <0.03 <0.031 ng/mL   Dg Chest 2 View  05/26/2015  CLINICAL DATA:  Chest pain, left arm pain EXAM: CHEST  2 VIEW COMPARISON:  08/28/2012 FINDINGS: The heart size and mediastinal contours are within normal limits. Both lungs are clear. The visualized skeletal structures are unremarkable. IMPRESSION: No active cardiopulmonary disease. Electronically Signed   By: Rolm Baptise M.D.   On: 05/26/2015 12:58      I have personally reviewed and evaluated these images and lab results as part of my medical decision-making.   EKG Interpretation   Date/Time:  Tuesday May 26 2015 11:42:01 EST Ventricular Rate:  58 PR Interval:  200 QRS Duration: 86 QT Interval:  400 QTC Calculation: 392 R Axis:   4 Text Interpretation:  Sinus bradycardia Minimal voltage criteria for LVH,  may be normal variant Nonspecific ST and T wave abnormality No previous  tracing Confirmed by Ashok Cordia  MD, Lennette Bihari ([id number]) on 05/26/2015 12:06:49 PM      MDM    Iv ns. Labs. Ecg. Cxr.  Reviewed nursing notes and prior charts for additional history.   Recheck no pain or discomfort. No sob.  Discussed initial labs w pt, and that repeat/delta troponin pending.   Will sign out to Dr Ralene Bathe - if delta trop normal/not increasing, feel pt stable for d/c with close outpt f/u.        Lajean Saver, MD 05/26/15 [id number]

## 2015-05-26 NOTE — ED Notes (Signed)
CP since 1030am

## 2015-05-26 NOTE — Discharge Instructions (Signed)
It was our pleasure to provide your ER care today - we hope that you feel better.  Follow up with primary care doctor/cardiologist in the coming week - if recurrent chest pain, discuss possible stress testing.   Return to ER right away if worse, new symptoms,  trouble breathing, recurrent or persistent chest pain, medical emergency, other concern.     Nonspecific Chest Pain  Chest pain can be caused by many different conditions. There is always a chance that your pain could be related to something serious, such as a heart attack or a blood clot in your lungs. Chest pain can also be caused by conditions that are not life-threatening. If you have chest pain, it is very important to follow up with your health care provider. CAUSES  Chest pain can be caused by:  Heartburn.  Pneumonia or bronchitis.  Anxiety or stress.  Inflammation around your heart (pericarditis) or lung (pleuritis or pleurisy).  A blood clot in your lung.  A collapsed lung (pneumothorax). It can develop suddenly on its own (spontaneous pneumothorax) or from trauma to the chest.  Shingles infection (varicella-zoster virus).  Heart attack.  Damage to the bones, muscles, and cartilage that make up your chest wall. This can include:  Bruised bones due to injury.  Strained muscles or cartilage due to frequent or repeated coughing or overwork.  Fracture to one or more ribs.  Sore cartilage due to inflammation (costochondritis). RISK FACTORS  Risk factors for chest pain may include:  Activities that increase your risk for trauma or injury to your chest.  Respiratory infections or conditions that cause frequent coughing.  Medical conditions or overeating that can cause heartburn.  Heart disease or family history of heart disease.  Conditions or health behaviors that increase your risk of developing a blood clot.  Having had chicken pox (varicella zoster). SIGNS AND SYMPTOMS Chest pain can feel  like:  Burning or tingling on the surface of your chest or deep in your chest.  Crushing, pressure, aching, or squeezing pain.  Dull or sharp pain that is worse when you move, cough, or take a deep breath.  Pain that is also felt in your back, neck, shoulder, or arm, or pain that spreads to any of these areas. Your chest pain may come and go, or it may stay constant. DIAGNOSIS Lab tests or other studies may be needed to find the cause of your pain. Your health care provider may have you take a test called an ambulatory ECG (electrocardiogram). An ECG records your heartbeat patterns at the time the test is performed. You may also have other tests, such as:  Transthoracic echocardiogram (TTE). During echocardiography, sound waves are used to create a picture of all of the heart structures and to look at how blood flows through your heart.  Transesophageal echocardiogram (TEE).This is a more advanced imaging test that obtains images from inside your body. It allows your health care provider to see your heart in finer detail.  Cardiac monitoring. This allows your health care provider to monitor your heart rate and rhythm in real time.  Holter monitor. This is a portable device that records your heartbeat and can help to diagnose abnormal heartbeats. It allows your health care provider to track your heart activity for several days, if needed.  Stress tests. These can be done through exercise or by taking medicine that makes your heart beat more quickly.  Blood tests.  Imaging tests. TREATMENT  Your treatment depends on what is causing  your chest pain. Treatment may include:  Medicines. These may include:  Acid blockers for heartburn.  Anti-inflammatory medicine.  Pain medicine for inflammatory conditions.  Antibiotic medicine, if an infection is present.  Medicines to dissolve blood clots.  Medicines to treat coronary artery disease.  Supportive care for conditions that do not  require medicines. This may include:  Resting.  Applying heat or cold packs to injured areas.  Limiting activities until pain decreases. HOME CARE INSTRUCTIONS  If you were prescribed an antibiotic medicine, finish it all even if you start to feel better.  Avoid any activities that bring on chest pain.  Do not use any tobacco products, including cigarettes, chewing tobacco, or electronic cigarettes. If you need help quitting, ask your health care provider.  Do not drink alcohol.  Take medicines only as directed by your health care provider.  Keep all follow-up visits as directed by your health care provider. This is important. This includes any further testing if your chest pain does not go away.  If heartburn is the cause for your chest pain, you may be told to keep your head raised (elevated) while sleeping. This reduces the chance that acid will go from your stomach into your esophagus.  Make lifestyle changes as directed by your health care provider. These may include:  Getting regular exercise. Ask your health care provider to suggest some activities that are safe for you.  Eating a heart-healthy diet. A registered dietitian can help you to learn healthy eating options.  Maintaining a healthy weight.  Managing diabetes, if necessary.  Reducing stress. SEEK MEDICAL CARE IF:  Your chest pain does not go away after treatment.  You have a rash with blisters on your chest.  You have a fever. SEEK IMMEDIATE MEDICAL CARE IF:   Your chest pain is worse.  You have an increasing cough, or you cough up blood.  You have severe abdominal pain.  You have severe weakness.  You faint.  You have chills.  You have sudden, unexplained chest discomfort.  You have sudden, unexplained discomfort in your arms, back, neck, or jaw.  You have shortness of breath at any time.  You suddenly start to sweat, or your skin gets clammy.  You feel nauseous or you vomit.  You  suddenly feel light-headed or dizzy.  Your heart begins to beat quickly, or it feels like it is skipping beats. These symptoms may represent a serious problem that is an emergency. Do not wait to see if the symptoms will go away. Get medical help right away. Call your local emergency services (911 in the [place] Do not drive yourself to the hospital.   This information is not intended to replace advice given to you by your health care provider. Make sure you discuss any questions you have with your health care provider.   Document Released: 03/30/2005 Document Revised: 07/11/2014 Document Reviewed: 01/24/2014 Elsevier Interactive Patient Education 2016 [employer]   Chest Wall Pain Chest wall pain is pain in or around the bones and muscles of your chest. Sometimes, an injury causes this pain. Sometimes, the cause may not be known. This pain may take several weeks or longer to get better. HOME CARE INSTRUCTIONS  Pay attention to any changes in your symptoms. Take these actions to help with your pain:   Rest as told by your health care provider.   Avoid activities that cause pain. These include any activities that use your chest muscles or your abdominal and side muscles  to lift heavy items.   If directed, apply ice to the painful area:  Put ice in a plastic bag.  Place a towel between your skin and the bag.  Leave the ice on for 20 minutes, 2-3 times per day.  Take over-the-counter and prescription medicines only as told by your health care provider.  Do not use tobacco products, including cigarettes, chewing tobacco, and e-cigarettes. If you need help quitting, ask your health care provider.  Keep all follow-up visits as told by your health care provider. This is important. SEEK MEDICAL CARE IF:  You have a fever.  Your chest pain becomes worse.  You have new symptoms. SEEK IMMEDIATE MEDICAL CARE IF:  You have nausea or vomiting.  You feel sweaty or  light-headed.  You have a cough with phlegm (sputum) or you cough up blood.  You develop shortness of breath.   This information is not intended to replace advice given to you by your health care provider. Make sure you discuss any questions you have with your health care provider.   Document Released: 06/20/2005 Document Revised: 03/11/2015 Document Reviewed: 09/15/2014 Elsevier Interactive Patient Education [employer].

## 2015-09-29 ENCOUNTER — Other Ambulatory Visit: Payer: Self-pay | Admitting: Urology

## 2015-10-22 NOTE — Patient Instructions (Signed)
Harold Perez  10/22/2015   Your procedure is scheduled on: 10/27/2015    Report to [facility] Main  Entrance take Alex  elevators to 3rd floor to  [place] at    0830 AM.  Call this number if you have problems the morning of surgery [phone]   Remember: ONLY 1 PERSON MAY GO WITH YOU TO SHORT STAY TO GET  READY MORNING OF [place].  Do not eat food or drink liquids :After Midnight.     Take these medicines the morning of surgery with A SIP OF WATER: none                                 You may not have any metal on your body including hair pins and              piercings  Do not wear jewelry,, lotions, powders or perfumes, deodorant                           Men may shave face and neck.   Do not bring valuables to the hospital. [place].  Contacts, dentures or bridgework may not be worn into surgery.  Leave suitcase in the car. After surgery it may be brought to your room.        Special Instructions: coughing and deep breathing exercises,leg exercises               Please read over the following fact sheets you were given: _____________________________________________________________________             [facility] - Preparing for Surgery Before surgery, you can play an important role.  Because skin is not sterile, your skin needs to be as free of germs as possible.  You can reduce the number of germs on your skin by washing with CHG (chlorahexidine gluconate) soap before surgery.  CHG is an antiseptic cleaner which kills germs and bonds with the skin to continue killing germs even after washing. Please DO NOT use if you have an allergy to CHG or antibacterial soaps.  If your skin becomes reddened/irritated stop using the CHG and inform your nurse when you arrive at Short Stay. Do not shave (including legs and underarms) for at least 48 hours prior to the first CHG shower.  You may  shave your face/neck. Please follow these instructions carefully:  1.  Shower with CHG Soap the night before surgery and the  morning of Surgery.  2.  If you choose to wash your hair, wash your hair first as usual with your  normal  shampoo.  3.  After you shampoo, rinse your hair and body thoroughly to remove the  shampoo.                           4.  Use CHG as you would any other liquid soap.  You can apply chg directly  to the skin and wash                       Gently with a scrungie or clean washcloth.  5.  Apply the  CHG Soap to your body ONLY FROM THE NECK DOWN.   Do not use on face/ open                           Wound or open sores. Avoid contact with eyes, ears mouth and genitals (private parts).                       Wash face,  Genitals (private parts) with your normal soap.             6.  Wash thoroughly, paying special attention to the area where your surgery  will be performed.  7.  Thoroughly rinse your body with warm water from the neck down.  8.  DO NOT shower/wash with your normal soap after using and rinsing off  the CHG Soap.                9.  Pat yourself dry with a clean towel.            10.  Wear clean pajamas.            11.  Place clean sheets on your bed the night of your first shower and do not  sleep with pets. Day of Surgery : Do not apply any lotions/deodorants the morning of surgery.  Please wear clean clothes to the hospital/surgery center.  FAILURE TO FOLLOW THESE INSTRUCTIONS MAY RESULT IN THE CANCELLATION OF YOUR SURGERY PATIENT SIGNATURE_________________________________  NURSE SIGNATURE__________________________________  ________________________________________________________________________  WHAT IS A BLOOD TRANSFUSION? Blood Transfusion Information  A transfusion is the replacement of blood or some of its parts. Blood is made up of multiple cells which provide different functions.  Red blood cells carry oxygen and are used for blood loss  replacement.  White blood cells fight against infection.  Platelets control bleeding.  Plasma helps clot blood.  Other blood products are available for specialized needs, such as hemophilia or other clotting disorders. BEFORE THE TRANSFUSION  Who gives blood for transfusions?   Healthy volunteers who are fully evaluated to make sure their blood is safe. This is blood bank blood. Transfusion therapy is the safest it has ever been in the practice of medicine. Before blood is taken from a donor, a complete history is taken to make sure that person has no history of diseases nor engages in risky social behavior (examples are intravenous drug use or sexual activity with multiple partners). The donor's travel history is screened to minimize risk of transmitting infections, such as malaria. The donated blood is tested for signs of infectious diseases, such as HIV and hepatitis. The blood is then tested to be sure it is compatible with you in order to minimize the chance of a transfusion reaction. If you or a relative donates blood, this is often done in anticipation of surgery and is not appropriate for emergency situations. It takes many days to process the donated blood. RISKS AND COMPLICATIONS Although transfusion therapy is very safe and saves many lives, the main dangers of transfusion include:   Getting an infectious disease.  Developing a transfusion reaction. This is an allergic reaction to something in the blood you were given. Every precaution is taken to prevent this. The decision to have a blood transfusion has been considered carefully by your caregiver before blood is given. Blood is not given unless the benefits outweigh the risks. AFTER THE TRANSFUSION  Right after receiving a blood transfusion,  you will usually feel much better and more energetic. This is especially true if your red blood cells have gotten low (anemic). The transfusion raises the level of the red blood cells which  carry oxygen, and this usually causes an energy increase.  The nurse administering the transfusion will monitor you carefully for complications. HOME CARE INSTRUCTIONS  No special instructions are needed after a transfusion. You may find your energy is better. Speak with your caregiver about any limitations on activity for underlying diseases you may have. SEEK MEDICAL CARE IF:   Your condition is not improving after your transfusion.  You develop redness or irritation at the intravenous (IV) site. SEEK IMMEDIATE MEDICAL CARE IF:  Any of the following symptoms occur over the next 12 hours:  Shaking chills.  You have a temperature by mouth above 102 F (38.9 C), not controlled by medicine.  Chest, back, or muscle pain.  People around you feel you are not acting correctly or are confused.  Shortness of breath or difficulty breathing.  Dizziness and fainting.  You get a rash or develop hives.  You have a decrease in urine output.  Your urine turns a dark color or changes to pink, red, or brown. Any of the following symptoms occur over the next 10 days:  You have a temperature by mouth above 102 F (38.9 C), not controlled by medicine.  Shortness of breath.  Weakness after normal activity.  The white part of the eye turns yellow (jaundice).  You have a decrease in the amount of urine or are urinating less often.  Your urine turns a dark color or changes to pink, red, or brown. Document Released: 06/17/2000 Document Revised: 09/12/2011 Document Reviewed: 02/04/2008 [facility] Patient Information 2014 [place], [place].  _______________________________________________________________________

## 2015-10-26 ENCOUNTER — Encounter (HOSPITAL_COMMUNITY): Payer: Self-pay

## 2015-10-26 ENCOUNTER — Encounter (HOSPITAL_COMMUNITY)
Admission: RE | Admit: 2015-10-26 | Discharge: 2015-10-26 | Disposition: A | Discharge status: Home / Self Care | Payer: 59 | Source: Ambulatory Visit | Attending: Urology | Admitting: Urology

## 2015-10-26 DIAGNOSIS — E119 Type 2 diabetes mellitus without complications: Secondary | ICD-10-CM | POA: Diagnosis not present

## 2015-10-26 DIAGNOSIS — N393 Stress incontinence (female) (male): Secondary | ICD-10-CM | POA: Diagnosis present

## 2015-10-26 DIAGNOSIS — N3946 Mixed incontinence: Secondary | ICD-10-CM | POA: Diagnosis not present

## 2015-10-26 DIAGNOSIS — Z8042 Family history of malignant neoplasm of prostate: Secondary | ICD-10-CM | POA: Diagnosis not present

## 2015-10-26 LAB — BASIC METABOLIC PANEL
Anion gap: 11 (ref 5–15)
BUN: 18 mg/dL (ref 6–20)
CHLORIDE: 103 mmol/L (ref 101–111)
CO2: 24 mmol/L (ref 22–32)
CREATININE: 1.03 mg/dL (ref 0.61–1.24)
Calcium: 9.6 mg/dL (ref 8.9–10.3)
GFR calc non Af Amer: >60 mL/min (ref >60)
Glucose, Bld: 100 mg/dL — ABNORMAL HIGH (ref 65–99)
POTASSIUM: 4.3 mmol/L (ref 3.5–5.1)
Sodium: 138 mmol/L (ref 135–145)

## 2015-10-26 LAB — PROTIME-INR
INR: 1.11 (ref 0.00–1.49)
Prothrombin Time: 14.1 seconds (ref 11.6–15.2)

## 2015-10-26 LAB — CBC
HEMATOCRIT: 41.2 % (ref 39.0–52.0)
Hemoglobin: 13.4 g/dL (ref 13.0–17.0)
MCH: 27.7 pg (ref 26.0–34.0)
MCHC: 32.5 g/dL (ref 30.0–36.0)
MCV: 85.1 fL (ref 78.0–100.0)
PLATELETS: 187 10*3/uL (ref 150–400)
RBC: 4.84 MIL/uL (ref 4.22–5.81)
RDW: 14.6 % (ref 11.5–15.5)
WBC: 5.7 10*3/uL (ref 4.0–10.5)

## 2015-10-26 LAB — APTT: aPTT: 26 seconds (ref 24–37)

## 2015-10-26 MED ORDER — GENTAMICIN IN SALINE 1.6-0.9 MG/ML-% IV SOLN
80.0000 mg | INTRAVENOUS | Status: DC
Start: 2015-10-26 — End: 2015-10-26
  Filled 2015-10-26: qty 50

## 2015-10-26 MED ORDER — GENTAMICIN SULFATE 40 MG/ML IJ SOLN
5.0000 mg/kg | INTRAVENOUS | Status: AC
  Administered 2015-10-27: 480 mg via INTRAVENOUS
  Filled 2015-10-26: qty 12

## 2015-10-26 NOTE — Pre-Procedure Instructions (Signed)
EKG/ CXR 11'16 Epic.

## 2015-10-26 NOTE — H&P (Signed)
History of Present Illness   I was consulted by Dr Risa Grill regarding Mr Harold Perez urinary incontinence that began following his robotic-assisted radical prostatectomy. Things have improved but he wears 1 pad a day which is damp. It is worse when he is active. He is quite active working for [employer]. He sometimes leaks with coughing and sneezing. He says he has a little bit of urge incontinence. I am not convinced he has small volume enuresis.    Mr Zeiders had a negative cough test in the standing position. He is here to discuss his urodynamics.   On urodynamics, he did not void and was catheterized for 75 mL. His maximum capacity was 475 mL. The bladder was stable. At 200 mL he leaked a mild to moderate amount at 130 cmH2O. At 300 mL he leaked a mild to moderate amount at 117 cmH2O. He did not leak with coughing during the study. During voluntary voiding he voided 473 mL with a maximum flow of 11 mL/s. The maximum voiding pressure was 12 cmH2O. He emptied efficiently. EMG activity was normal. Fluoroscopically he had some funneling of his bladder neck. He told Hassan Rowan that first thing in the morning he might have a little bit of urge incontinence if he does not go to the restroom first.    Past Medical History Problems  1. History of gout (Z87.39) 2. History of heartburn [id number])  Surgical History Problems  1. Denied: History Of Prior Surgery 2. 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 Cialis 20 MG Oral Tablet; TAKE 1 TABLET As Directed;  Therapy: [id number] to (Last Rx:09Jul2015)  Requested for: [id number] Ordered 2. Muse 1000 MCG Urethral Pellet; Insert 1 suppository intraurethrally as directed;  Therapy: [id number] to (Last Rx:14Mar2016) Ordered 3. Prostaglandin E1 20ug/mL; INJECT 0.2 TO 0.4 MILLILITERS PER       INJECTION [= 20  TO 40 UNITS];  Therapy: [id number] to (Last Rx:20Jan2017) Ordered  Allergies Medication  1. No Known Drug  Allergies  Family History Problems  1. Family history of diabetes mellitus (Z83.3) : Mother 2. Family history of malignant neoplasm of breast (Z80.3) : Mother 3. Family history of prostate cancer (Z80.42) : Father 4. Family history of Hematuria : Father  Social History Problems  1. Activities of daily living (ADL's), independent 2. Denied: History of Alcohol use 3. Denied: History of Caffeine use 4. Exercise habits   weightlifting 3x/month, walking 4 miles every other day, basketball and softball 2-3x/wk. 5. Father deceased   16yrs, pca 6. Married 7. Mother deceased   64yrs, Breast ca 8. Never smoker 9. 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 10. Three children  Assessment Assessed  1. Urge and stress incontinence (N39.46)  Plan Male stress incontinence  1. Follow-up Schedule Surgery Office  Follow-up  Status: Complete  Done: [id number]  Discussion/Summary   By history, Mr Wallock did not try VESIcare. He may have tried Myrbetriq or oxybutynin in the past.   I talked to him about watchful waiting versus a sling.   I drew him a picture and we talked about a male sling in detail. Pros, cons, general surgical and anesthetic risks, and other options including behavioral therapy, the artificial sphincter, and watchful waiting were discussed. He understands that male slings are successful in 80-85% of cases for stress incontinence (dry in approximately half), 50% for urge incontinence, and  that in a small percentage of cases the incontinence can worsen. Surgical risks were described but not limited to the discussion of injury to neighboring structures including the bowel (with possible life-threatening sepsis and colostomy), bladder, urethra, and tendons/muscles (all resulting in further surgery). We talked about injury to nerves/soft tissue leading to debilitating and intractable  pelvic, abdominal, and lower extremity pain syndromes and neuropathies. The risks of urinary retention requiring catheterization and slowing of urinary stream were discussed. The risk of urethral erosion and infection were discussed with sequelae. Bleeding risks with transfusion rates and risks of perineal numbness and erectile dysfunction were discussed. The usual post-operative course was described. The patient understands that he might not reach his treatment goal and that he might be worse following surgery.  My concern is his narrow margin of improvement. He wears a light pad when he is lifting at work, and he will go through 2 to 3 of them. Otherwise, he uses 1 thick pad. He understands very well with milder leakage to have appropriate expectations.   He would like to proceed with surgery. I gave him 4 weeks of VESIcare samples just to try. He will cancel surgery if he reaches his treatment goal.   Overall, I think it is a very reasonable decision for Mr Music. He is a young man, and hopefully he will achieve dryness.   After a thorough review of the management options for the patient's condition the patient  elected to proceed with surgical therapy as noted above. We have discussed the potential benefits and risks of the procedure, side effects of the proposed treatment, the likelihood of the patient achieving the goals of the procedure, and any potential problems that might occur during the procedure or recuperation. Informed consent has been obtained.

## 2015-10-27 ENCOUNTER — Observation Stay (HOSPITAL_COMMUNITY)
Admission: RE | Admit: 2015-10-27 | Discharge: 2015-10-28 | Disposition: A | Discharge status: Home / Self Care | Payer: 59 | Source: Ambulatory Visit | Attending: Urology | Admitting: Urology

## 2015-10-27 ENCOUNTER — Encounter (HOSPITAL_COMMUNITY): Payer: Self-pay | Admitting: Anesthesiology

## 2015-10-27 ENCOUNTER — Ambulatory Visit (HOSPITAL_COMMUNITY): Payer: 59 | Admitting: Anesthesiology

## 2015-10-27 ENCOUNTER — Encounter (HOSPITAL_COMMUNITY)
Admission: RE | Disposition: A | Discharge status: Home / Self Care | Payer: Self-pay | Source: Ambulatory Visit | Attending: Urology

## 2015-10-27 DIAGNOSIS — Z8042 Family history of malignant neoplasm of prostate: Secondary | ICD-10-CM | POA: Insufficient documentation

## 2015-10-27 DIAGNOSIS — E119 Type 2 diabetes mellitus without complications: Secondary | ICD-10-CM | POA: Insufficient documentation

## 2015-10-27 DIAGNOSIS — R32 Unspecified urinary incontinence: Secondary | ICD-10-CM | POA: Diagnosis present

## 2015-10-27 DIAGNOSIS — N3946 Mixed incontinence: Secondary | ICD-10-CM | POA: Diagnosis not present

## 2015-10-27 HISTORY — PX: URETHRAL SLING: SHX2621

## 2015-10-27 LAB — TYPE AND SCREEN
ABO/RH(D): O POS
ANTIBODY SCREEN: NEGATIVE

## 2015-10-27 LAB — HEMOGLOBIN AND HEMATOCRIT, BLOOD
HEMATOCRIT: 40.1 % (ref 39.0–52.0)
HEMOGLOBIN: 13.7 g/dL (ref 13.0–17.0)

## 2015-10-27 SURGERY — CREATION, URETHRAL SLING, MALE
Anesthesia: General | Site: Urethra

## 2015-10-27 MED ORDER — CEFAZOLIN SODIUM-DEXTROSE 2-4 GM/100ML-% IV SOLN
INTRAVENOUS | Status: AC
  Filled 2015-10-27: qty 100

## 2015-10-27 MED ORDER — FENTANYL CITRATE (PF) 100 MCG/2ML IJ SOLN
25.0000 ug | INTRAMUSCULAR | Status: DC | PRN
  Administered 2015-10-27 (×2): 50 ug via INTRAVENOUS

## 2015-10-27 MED ORDER — LACTATED RINGERS IV SOLN: INTRAVENOUS | Status: DC

## 2015-10-27 MED ORDER — MORPHINE SULFATE (PF) 2 MG/ML IV SOLN: 2.0000 mg | INTRAVENOUS | Status: DC | PRN

## 2015-10-27 MED ORDER — MIDAZOLAM HCL 2 MG/2ML IJ SOLN
INTRAMUSCULAR | Status: AC
  Filled 2015-10-27: qty 2

## 2015-10-27 MED ORDER — SUGAMMADEX SODIUM 200 MG/2ML IV SOLN
INTRAVENOUS | Status: DC | PRN
  Administered 2015-10-27: 300 mg via INTRAVENOUS

## 2015-10-27 MED ORDER — POLYMYXIN B SULFATE 500000 UNITS IJ SOLR
INTRAMUSCULAR | Status: DC | PRN
  Administered 2015-10-27: 500 mL

## 2015-10-27 MED ORDER — PROPOFOL 10 MG/ML IV BOLUS
INTRAVENOUS | Status: DC | PRN
  Administered 2015-10-27: 200 mg via INTRAVENOUS

## 2015-10-27 MED ORDER — DIPHENHYDRAMINE HCL 12.5 MG/5ML PO ELIX: 12.5000 mg | ORAL_SOLUTION | Freq: Four times a day (QID) | ORAL | Status: DC | PRN

## 2015-10-27 MED ORDER — ONDANSETRON HCL 4 MG/2ML IJ SOLN
INTRAMUSCULAR | Status: AC
  Filled 2015-10-27: qty 2

## 2015-10-27 MED ORDER — SUGAMMADEX SODIUM 200 MG/2ML IV SOLN
INTRAVENOUS | Status: AC
  Filled 2015-10-27: qty 2

## 2015-10-27 MED ORDER — FENTANYL CITRATE (PF) 100 MCG/2ML IJ SOLN
INTRAMUSCULAR | Status: AC
  Filled 2015-10-27: qty 2

## 2015-10-27 MED ORDER — PROPOFOL 10 MG/ML IV BOLUS
INTRAVENOUS | Status: AC
  Filled 2015-10-27: qty 20

## 2015-10-27 MED ORDER — ROCURONIUM BROMIDE 100 MG/10ML IV SOLN
INTRAVENOUS | Status: DC | PRN
  Administered 2015-10-27: 50 mg via INTRAVENOUS
  Administered 2015-10-27: 20 mg via INTRAVENOUS
  Administered 2015-10-27: 10 mg via INTRAVENOUS

## 2015-10-27 MED ORDER — ONDANSETRON HCL 4 MG/2ML IJ SOLN
INTRAMUSCULAR | Status: DC | PRN
  Administered 2015-10-27: 4 mg via INTRAVENOUS

## 2015-10-27 MED ORDER — LACTATED RINGERS IV SOLN: INTRAVENOUS | Status: DC | PRN

## 2015-10-27 MED ORDER — FENTANYL CITRATE (PF) 100 MCG/2ML IJ SOLN
INTRAMUSCULAR | Status: DC | PRN
  Administered 2015-10-27 (×5): 50 ug via INTRAVENOUS

## 2015-10-27 MED ORDER — LACTATED RINGERS IV SOLN
INTRAVENOUS | Status: DC | PRN
  Administered 2015-10-27 (×2): via INTRAVENOUS

## 2015-10-27 MED ORDER — ACETAMINOPHEN 325 MG PO TABS: 650.0000 mg | ORAL_TABLET | ORAL | Status: DC | PRN

## 2015-10-27 MED ORDER — CEFAZOLIN SODIUM-DEXTROSE 2-4 GM/100ML-% IV SOLN
2.0000 g | INTRAVENOUS | Status: AC
  Administered 2015-10-27: 2 g via INTRAVENOUS

## 2015-10-27 MED ORDER — HYDROCODONE-ACETAMINOPHEN 5-325 MG PO TABS: 1.0000 | ORAL_TABLET | Freq: Four times a day (QID) | ORAL | Status: DC | PRN

## 2015-10-27 MED ORDER — FENTANYL CITRATE (PF) 250 MCG/5ML IJ SOLN
INTRAMUSCULAR | Status: AC
  Filled 2015-10-27: qty 5

## 2015-10-27 MED ORDER — ONDANSETRON HCL 4 MG/2ML IJ SOLN: 4.0000 mg | INTRAMUSCULAR | Status: DC | PRN

## 2015-10-27 MED ORDER — DEXAMETHASONE SODIUM PHOSPHATE 10 MG/ML IJ SOLN
INTRAMUSCULAR | Status: AC
  Filled 2015-10-27: qty 1

## 2015-10-27 MED ORDER — MIDAZOLAM HCL 5 MG/5ML IJ SOLN
INTRAMUSCULAR | Status: DC | PRN
  Administered 2015-10-27: 2 mg via INTRAVENOUS

## 2015-10-27 MED ORDER — SODIUM CHLORIDE 0.9 % IR SOLN
Status: AC
  Filled 2015-10-27: qty 1

## 2015-10-27 MED ORDER — DEXAMETHASONE SODIUM PHOSPHATE 10 MG/ML IJ SOLN
INTRAMUSCULAR | Status: DC | PRN
  Administered 2015-10-27: 10 mg via INTRAVENOUS

## 2015-10-27 MED ORDER — DIPHENHYDRAMINE HCL 50 MG/ML IJ SOLN: 12.5000 mg | Freq: Four times a day (QID) | INTRAMUSCULAR | Status: DC | PRN

## 2015-10-27 MED ORDER — HYDROCODONE-ACETAMINOPHEN 5-325 MG PO TABS
1.0000 | ORAL_TABLET | ORAL | Status: DC | PRN
  Administered 2015-10-27 – 2015-10-28 (×3): 2 via ORAL
  Filled 2015-10-27 (×3): qty 2

## 2015-10-27 MED ORDER — DEXTROSE-NACL 5-0.45 % IV SOLN
INTRAVENOUS | Status: DC
  Administered 2015-10-27 (×2): via INTRAVENOUS

## 2015-10-27 SURGICAL SUPPLY — 46 items
BAG URINE DRAINAGE (UROLOGICAL SUPPLIES) ×2 IMPLANT
BLADE HEX COATED 2.75 (ELECTRODE) ×2 IMPLANT
BLADE SURG 15 STRL LF DISP TIS (BLADE) ×2 IMPLANT
BLADE SURG 15 STRL SS (BLADE) ×2
BLADE SURG SZ10 CARB STEEL (BLADE) IMPLANT
BNDG GAUZE ELAST 4 BULKY (GAUZE/BANDAGES/DRESSINGS) ×2 IMPLANT
BRIEF STRETCH FOR OB PAD LRG (UNDERPADS AND DIAPERS) ×2 IMPLANT
CATH FOLEY 2WAY 5CC 16FR (CATHETERS) ×1
CATH FOLEY 2WAY SLVR  5CC 14FR (CATHETERS) ×1
CATH FOLEY 2WAY SLVR  5CC 16FR (CATHETERS) ×1
CATH FOLEY 2WAY SLVR 5CC 14FR (CATHETERS) ×1 IMPLANT
CATH FOLEY 2WAY SLVR 5CC 16FR (CATHETERS) ×1 IMPLANT
CATH URTH STD 16FR FL 2W DRN (CATHETERS) ×1 IMPLANT
COVER MAYO STAND STRL (DRAPES) IMPLANT
DECANTER SPIKE VIAL GLASS SM (MISCELLANEOUS) ×2 IMPLANT
DRAPE SHEET LG 3/4 BI-LAMINATE (DRAPES) ×2 IMPLANT
DRSG TELFA 3X8 NADH (GAUZE/BANDAGES/DRESSINGS) ×2 IMPLANT
ELECT PENCIL ROCKER SW 15FT (MISCELLANEOUS) ×2 IMPLANT
GAUZE SPONGE 4X4 16PLY XRAY LF (GAUZE/BANDAGES/DRESSINGS) ×4 IMPLANT
GLOVE BIOGEL M STRL SZ7.5 (GLOVE) ×4 IMPLANT
GOWN STRL REUS W/ TWL XL LVL3 (GOWN DISPOSABLE) ×3 IMPLANT
GOWN STRL REUS W/TWL XL LVL3 (GOWN DISPOSABLE) ×7 IMPLANT
HOLDER FOLEY CATH W/STRAP (MISCELLANEOUS) ×2 IMPLANT
KIT BASIN OR (CUSTOM PROCEDURE TRAY) ×2 IMPLANT
LIQUID BAND (GAUZE/BANDAGES/DRESSINGS) ×2 IMPLANT
PACK CYSTO (CUSTOM PROCEDURE TRAY) ×2 IMPLANT
PLUG CATH AND CAP STER (CATHETERS) ×2 IMPLANT
POSITIONER SURGICAL ARM (MISCELLANEOUS) ×4 IMPLANT
SHEET LAVH (DRAPES) ×2 IMPLANT
SLING ADVANCE MALE SYSTEM ×2 IMPLANT
SUT SILK 2 0 30  PSL (SUTURE) ×1
SUT SILK 2 0 30 PSL (SUTURE) ×1 IMPLANT
SUT VIC AB 2-0 CT1 27 (SUTURE)
SUT VIC AB 2-0 CT1 27XBRD (SUTURE) IMPLANT
SUT VIC AB 2-0 SH 27 (SUTURE) ×1
SUT VIC AB 2-0 SH 27X BRD (SUTURE) ×1 IMPLANT
SUT VIC AB 3-0 PS2 18 (SUTURE)
SUT VIC AB 3-0 PS2 18XBRD (SUTURE) IMPLANT
SUT VIC AB 3-0 SH 27 (SUTURE) ×4
SUT VIC AB 3-0 SH 27XBRD (SUTURE) ×4 IMPLANT
SUT VIC AB 4-0 PS2 27 (SUTURE) ×6 IMPLANT
SUT VIC AB 4-0 RB1 27 (SUTURE)
SUT VIC AB 4-0 RB1 27XBRD (SUTURE) IMPLANT
SYRINGE 10CC LL (SYRINGE) ×2 IMPLANT
TOWEL OR 17X26 10 PK STRL BLUE (TOWEL DISPOSABLE) ×2 IMPLANT
YANKAUER SUCT BULB TIP NO VENT (SUCTIONS) ×4 IMPLANT

## 2015-10-27 NOTE — Anesthesia Procedure Notes (Addendum)
Procedure Name: Intubation Date/Time: 10/27/2015 11:50 AM Performed by: Yader Criger, Virgel Gess Pre-anesthesia Checklist: Patient identified, Emergency Drugs available, Suction available, Patient being monitored and Timeout performed Patient Re-evaluated:Patient Re-evaluated prior to inductionOxygen Delivery Method: Circle system utilized Preoxygenation: Pre-oxygenation with 100% oxygen Intubation Type: IV induction Ventilation: Mask ventilation without difficulty Laryngoscope Size: Mac and 4 Grade View: Grade I Tube type: Oral Tube size: 7.5 mm Number of attempts: 1 Airway Equipment and Method: Stylet Placement Confirmation: ETT inserted through vocal cords under direct vision,  positive ETCO2,  CO2 detector and breath sounds checked- equal and bilateral Secured at: 23 cm Tube secured with: Tape Dental Injury: Teeth and Oropharynx as per pre-operative assessment     EMS student look only.

## 2015-10-27 NOTE — Transfer of Care (Signed)
Immediate Anesthesia Transfer of Care Note  Patient: Harold Perez  Procedure(s) Performed: Procedure(s): CYSTOSCOPY AND MALE SLING (N/A)  Patient Location: PACU  Anesthesia Type:General  Level of Consciousness:  sedated, patient cooperative and responds to stimulation  Airway & Oxygen Therapy:Patient Spontanous Breathing and Patient connected to face mask oxgen  Post-op Assessment:  Report given to PACU RN and Post -op Vital signs reviewed and stable  Post vital signs:  Reviewed and stable  Last Vitals:  Filed Vitals:   10/27/15 0752  BP: 154/81  Pulse: 63  Temp: 36.8 C  Resp: 16    Complications: No apparent anesthesia complications

## 2015-10-27 NOTE — Anesthesia Preprocedure Evaluation (Addendum)
Anesthesia Evaluation  Patient identified by MRN, date of birth, ID band Patient awake    Reviewed: Allergy & Precautions, NPO status , Patient's Chart, lab work & pertinent test results  Airway Mallampati: I  TM Distance: >3 FB Neck ROM: Full    Dental  (+) Teeth Intact   Pulmonary neg pulmonary ROS,    breath sounds clear to auscultation       Cardiovascular negative cardio ROS   Rhythm:Regular Rate:Normal     Neuro/Psych negative neurological ROS  negative psych ROS   GI/Hepatic Neg liver ROS, GERD  ,  Endo/Other  diabetes  Renal/GU negative Renal ROS  negative genitourinary   Musculoskeletal negative musculoskeletal ROS (+)   Abdominal   Peds negative pediatric ROS (+)  Hematology negative hematology ROS (+)   Anesthesia Other Findings   Reproductive/Obstetrics negative OB ROS                            Lab Results  Component Value Date   WBC 5.7 10/26/2015   HGB 13.4 10/26/2015   HCT 41.2 10/26/2015   MCV 85.1 10/26/2015   PLT 187 10/26/2015   Lab Results  Component Value Date   CREATININE 1.03 10/26/2015   BUN 18 10/26/2015   NA 138 10/26/2015   K 4.3 10/26/2015   CL 103 10/26/2015   CO2 24 10/26/2015   Lab Results  Component Value Date   INR 1.11 10/26/2015    05/2015 EKG: SB.   Anesthesia Physical Anesthesia Plan  ASA: II  Anesthesia Plan: General   Post-op Pain Management:    Induction: Intravenous  Airway Management Planned: Oral ETT  Additional Equipment:   Intra-op Plan:   Post-operative Plan: Extubation in OR  Informed Consent: I have reviewed the patients History and Physical, chart, labs and discussed the procedure including the risks, benefits and alternatives for the proposed anesthesia with the patient or authorized representative who has indicated his/her understanding and acceptance.   Dental advisory given  Plan Discussed with:  CRNA  Anesthesia Plan Comments:        Anesthesia Quick Evaluation

## 2015-10-27 NOTE — Anesthesia Postprocedure Evaluation (Signed)
Anesthesia Post Note  Patient: Akiles Didonna  Procedure(s) Performed: Procedure(s) (LRB): CYSTOSCOPY AND MALE SLING (N/A)  Patient location during evaluation: PACU Anesthesia Type: General Level of consciousness: awake and alert Pain management: pain level controlled Vital Signs Assessment: post-procedure vital signs reviewed and stable Respiratory status: spontaneous breathing, nonlabored ventilation, respiratory function stable and patient connected to nasal cannula oxygen Cardiovascular status: blood pressure returned to baseline and stable Postop Assessment: no signs of nausea or vomiting Anesthetic complications: no    Last Vitals:  Filed Vitals:   10/27/15 1428 10/27/15 1459  BP: 121/60 121/60  Pulse: 68 68  Temp: 36.5 C 36.5 C  Resp: 16     Last Pain:  Filed Vitals:   10/27/15 1500  PainSc: [place] Zailynn Brandel

## 2015-10-27 NOTE — Interval H&P Note (Signed)
History and Physical Interval Note:  10/27/2015 7:09 AM  Harold Perez  has presented today for surgery, with the diagnosis of stress incontinence  The various methods of treatment have been discussed with the patient and family. After consideration of risks, benefits and other options for treatment, the patient has consented to  Procedure(s): CYSTOSCOPY AND MALE SLING (N/A) as a surgical intervention .  The patient's history has been reviewed, patient examined, no change in status, stable for surgery.  I have reviewed the patient's chart and labs.  Questions were answered to the patient's satisfaction.     [name]

## 2015-10-27 NOTE — Progress Notes (Signed)
Post-op note  Subjective: The patient is doing well.  No complaints.  Objective: Vital signs in last 24 hours: Temp:  [97.7 F (36.5 C)-98.3 F (36.8 C)] 97.7 F (36.5 C) (04/25 1459) Pulse Rate:  [63-75] 68 (04/25 1459) Resp:  [12-18] 16 (04/25 1428) BP: (121-154)/(60-95) 121/60 mmHg (04/25 1459) SpO2:  [93 %-100 %] 98 % (04/25 1459) Weight:  [117.482 kg (259 lb)] 117.482 kg (259 lb) (04/25 0804)  Intake/Output from previous day:   Intake/Output this shift: Total I/O In: 1200 [I.V.:1200] Out: 425 [Urine:400; Blood:25]  Physical Exam:  General: Alert and oriented. Abdomen: Soft, Nondistended. Incisions: Clean and dry.  Lab Results:  Recent Labs  10/26/15 0935 10/27/15 1459  HGB 13.4 13.7  HCT 41.2 40.1    Assessment/Plan: POD#0   1) Continue to monitor      Christell Faith 10/27/2015, 3:40 PM

## 2015-10-27 NOTE — Op Note (Signed)
Preoperative diagnosis: Stress urinary incontinence Postoperative diagnosis: Stress urinary incontinence Surgery: Insertion of male sling and cystoscopy Surgeon: Dr. Nicki Reaper Charlee Squibb Assistant: Estill Bamberg dancy  The patient has the above diagnoses and consented the above procedure. Extra care was taken with leg positioning to minimize the risk of compartment syndrome and neuropathy and deep vein thrombosis. He had a very narrow pelvis and large thighs and I had to open up his legs minimally being cognizant of his positioning  Preoperative antibiotics were given. Foley catheter was inserted after cystoscopy did not identify a bladder neck contracture or any foreign body or cystitis and a normal urethra  I made a 5 cm perineal incision and dissected down thru the subcutaneous tissue and mobilized the subcutaneous tissue from the underlying muscle. The bulbospongiosus muscle was split in the midline I was very happy with the mobilization of the bulbar urethra and with sharp dissection I easily identified peroneal tendon. I placed a 3-0 Vicryl at that level. I could feel the triangle markings as described  I was very careful and marked the upper aspect of the obturator foramen below the abductor tendon with excellent thigh retraction because of the man size and anatomy. I used the foramen needle on both sides. I passed it through the obturator foramen. I made a stab incision.  I visually placed my finger in the upper aspect of the triangle in the perineum on the patient's left side. I did a double pop maneuver and passed trocar onto the pulp of my index finger. He had very thick tissue but I was very pleased with the passage. Sling was delivered on that side. Exactly the same was done on the other side.  I cinched the sling down minimally. I placed the 3-0 Vicryl through its middle aspect. I then cinched the sling down to the peroneal tendon and applied pressure as I pulled on both straps at the same time  and the bulbar urethra was elevated and ascended. I was very pleased with the visual anatomy  I then scoped the patient and there was straightening of the bulbar urethra. There was coaptation of the bladder neck. I had actually cystoscoped before this last maneuver as well to make sure there is no injury to the bladder neck with passage of the sling bilaterally. The 3-0 Vicryl had been cinched down minimally.  Irrigation was utilized. I did my 3 layers of a 3-0 Vicryl anatomic closure in the perineum followed by 4-0 subcuticular.  I made a separate stab incisions 2 cm below each inguinal incision. I delivered the sling through these, cut the sheath, and irrigated the sheaths. I then remove the sheaths. Interrupted 4-0 Vicryl was utilized.  Dermabond and my usual dry dressing was applied. BLood loss was less than 50 mL.   Hopefully the patient will reach histreatment goal. I was very pleased with the procedure. He did have very tight anatomy and hopefully the axis will still work well for him

## 2015-10-28 ENCOUNTER — Encounter (HOSPITAL_COMMUNITY): Payer: Self-pay | Admitting: Urology

## 2015-10-28 DIAGNOSIS — N3946 Mixed incontinence: Secondary | ICD-10-CM | POA: Diagnosis not present

## 2015-10-28 LAB — BASIC METABOLIC PANEL
Anion gap: 9 (ref 5–15)
BUN: 12 mg/dL (ref 6–20)
CHLORIDE: 101 mmol/L (ref 101–111)
CO2: 25 mmol/L (ref 22–32)
CREATININE: 1.09 mg/dL (ref 0.61–1.24)
Calcium: 9.1 mg/dL (ref 8.9–10.3)
GFR calc non Af Amer: >60 mL/min (ref >60)
GLUCOSE: 139 mg/dL — AB (ref 65–99)
Potassium: 4.2 mmol/L (ref 3.5–5.1)
Sodium: 135 mmol/L (ref 135–145)

## 2015-10-28 LAB — HEMOGLOBIN AND HEMATOCRIT, BLOOD
HEMATOCRIT: 38.7 % — AB (ref 39.0–52.0)
Hemoglobin: 12.7 g/dL — ABNORMAL LOW (ref 13.0–17.0)

## 2015-10-28 NOTE — Care Management Note (Signed)
Case Management Note  Patient Details  Name: Janssen Puls MRN: [id number] Date of Birth: 03-Apr-1971  Subjective/Objective: 45 y/o m admitted w/incontinence of urine. From home.                   Action/Plan:d/c plan home.   Expected Discharge Date:                  Expected Discharge Plan:  Home/Self Care  In-House Referral:     Discharge planning Services  CM Consult  Post Acute Care Choice:    Choice offered to:     DME Arranged:    DME Agency:     HH Arranged:    [place] Agency:     Status of Service:  Completed, signed off  Medicare Important Message Given:    Date Medicare IM Given:    Medicare IM give by:    Date Additional Medicare IM Given:    Additional Medicare Important Message give by:     If discussed at [place] of Stay Meetings, dates discussed:    Additional Comments:  Dessa Phi, RN 10/28/2015, 9:31 AM

## 2015-10-28 NOTE — Progress Notes (Signed)
Patient D/C'd home- refused W/C assist to exit and preferred to ambulate. Stated his ride was waiting for him at exit. Pain well under control- denies N/V. Voiding with no difficulty. Printed prescription and D/C summary given to patient. Also extra Telfa drsgs sent with patient.

## 2015-10-28 NOTE — Progress Notes (Signed)
Foley was discontinued. PIV  Was Saline Locked. Patient was asked to use the Urinal to Void. After 2 voids the patient will be bladder scanned and then the MD will be notified.

## 2015-10-28 NOTE — Progress Notes (Signed)
1 Day Post-Op Subjective: The patient is doing well.  No nausea or vomiting. Pain is adequately controlled.  Objective: Vital signs in last 24 hours: Temp:  [97.5 F (36.4 C)-98.3 F (36.8 C)] 98 F (36.7 C) (04/26 0453) Pulse Rate:  [63-81] 66 (04/26 0453) Resp:  [12-18] 18 (04/26 0453) BP: (119-154)/(60-95) 125/73 mmHg (04/26 0453) SpO2:  [93 %-100 %] 98 % (04/26 0453) Weight:  [117.482 kg (259 lb)] 117.482 kg (259 lb) (04/25 0804)  Intake/Output from previous day: 04/25 0701 - 04/26 0700 In: 2652.5 [P.O.:390; I.V.:2262.5] Out: [id number] [Urine:3950; Blood:25] Intake/Output this shift: Total I/O In: 562.5 [I.V.:562.5] Out: 3100 [Urine:3100]  Physical Exam:  General: Alert and oriented. CV: RRR Lungs: Clear bilaterally. GI: Soft, Nondistended. Incisions: Clean, dry, and intact Urine: Clear Extremities: Nontender, no erythema, no edema.  Lab Results:  Recent Labs  10/26/15 0935 10/27/15 1459 10/28/15 0506  HGB 13.4 13.7 12.7*  HCT 41.2 40.1 38.7*      Assessment/Plan: POD# 1 s/p male urethral sling  1) SL IVF 2) Ambulate, Incentive spirometry 3) Transition to oral pain medication 4) regular diet 5) D/C pelvic drain and foley 6) bladder scan x 2 and home when voiding well     Christell Faith 10/28/2015, 6:31 AM

## 2015-10-28 NOTE — Progress Notes (Signed)
Patient voided x2 this shift after FC removal. Bladder scanned as ordered after second void showing only 41 ml. No complications. MD made aware and OK with continuing with D/C. Pt requesting raised toilet seat for home- per CM, pt will have to cover cost out of pocket. Pt made aware and stated he would "just buy one at [facility]."

## 2015-10-28 NOTE — Discharge Instructions (Signed)
As discussed with Dr. Kaylyn Layer may restart aspirin, advil, aleve, vitamins, and supplements 7 days after surgery.  I have reviewed discharge instructions in detail with the patient. They will follow-up with me or their physician as scheduled. My nurse will also be calling the patients as per protocol.

## 2015-10-28 NOTE — Progress Notes (Signed)
Looks good  No leg pain Vitals and labs normal Detailed instruction Voiding trial

## 2015-10-28 NOTE — Discharge Summary (Signed)
Date of admission: 10/27/2015  Date of discharge: 10/28/2015  Admission diagnosis: Stress incontience  Discharge diagnosis: Stress incontinence  Secondary diagnoses: Prostate cancer  History and Physical: For full details, please see admission history and physical. Briefly, Harold Perez is a 45 y.o. year old patient with the above diagnosis.   Hospital Course: Male sling surgery went very well. Uneventful post op course  Laboratory values:  Recent Labs  10/26/15 0935 10/27/15 1459 10/28/15 0506  HGB 13.4 13.7 12.7*  HCT 41.2 40.1 38.7*    Recent Labs  10/26/15 0935 10/28/15 0506  CREATININE 1.03 1.09    Disposition: Home  Discharge instruction: The patient was instructed to be ambulatory but told to refrain from heavy lifting, strenuous activity, or driving. Detailed  Discharge medications:    Medication List    STOP taking these medications        solifenacin 5 MG tablet  Commonly known as:  VESICARE      TAKE these medications        HYDROcodone-acetaminophen 5-325 MG tablet  Commonly known as:  NORCO  Take 1-2 tablets by mouth every 6 (six) hours as needed for moderate pain.        Followup:      Follow-up Information    Follow up with Shanasia Ibrahim A, MD.   Specialty:  Urology   Why:  office will call you with date and time of appointment   Contact information:   [place] [address] [phone]       Follow up with Caraline Deutschman A, MD.   Specialty:  Urology   Why:  as scheduled   Contact information:   [place] [address] [phone]

## 2015-11-13 ENCOUNTER — Other Ambulatory Visit: Payer: Self-pay | Admitting: Emergency Medicine

## 2016-10-30 ENCOUNTER — Emergency Department (HOSPITAL_BASED_OUTPATIENT_CLINIC_OR_DEPARTMENT_OTHER): Payer: 59

## 2016-10-30 ENCOUNTER — Encounter (HOSPITAL_BASED_OUTPATIENT_CLINIC_OR_DEPARTMENT_OTHER): Payer: Self-pay

## 2016-10-30 ENCOUNTER — Emergency Department (HOSPITAL_BASED_OUTPATIENT_CLINIC_OR_DEPARTMENT_OTHER)
Admission: EM | Admit: 2016-10-30 | Discharge: 2016-10-30 | Disposition: A | Discharge status: Home / Self Care | Payer: 59 | Attending: Emergency Medicine | Admitting: Emergency Medicine

## 2016-10-30 DIAGNOSIS — Z8546 Personal history of malignant neoplasm of prostate: Secondary | ICD-10-CM | POA: Insufficient documentation

## 2016-10-30 DIAGNOSIS — E119 Type 2 diabetes mellitus without complications: Secondary | ICD-10-CM | POA: Insufficient documentation

## 2016-10-30 DIAGNOSIS — R519 Headache, unspecified: Secondary | ICD-10-CM

## 2016-10-30 DIAGNOSIS — G4482 Headache associated with sexual activity: Secondary | ICD-10-CM

## 2016-10-30 DIAGNOSIS — R51 Headache: Secondary | ICD-10-CM | POA: Insufficient documentation

## 2016-10-30 LAB — GRAM STAIN: Gram Stain: NONE SEEN

## 2016-10-30 LAB — CSF CELL COUNT WITH DIFFERENTIAL
RBC COUNT CSF: 1 /mm3 — AB
RBC Count, CSF: 0 /mm3
TUBE #: 4
Tube #: 1
WBC, CSF: 2 /mm3 (ref 0–5)
WBC, CSF: 2 /mm3 (ref 0–5)

## 2016-10-30 MED ORDER — PROCHLORPERAZINE EDISYLATE 5 MG/ML IJ SOLN
10.0000 mg | Freq: Once | INTRAMUSCULAR | Status: AC
  Administered 2016-10-30: 10 mg via INTRAVENOUS
  Filled 2016-10-30: qty 2

## 2016-10-30 MED ORDER — DEXAMETHASONE SODIUM PHOSPHATE 10 MG/ML IJ SOLN
10.0000 mg | Freq: Once | INTRAMUSCULAR | Status: AC
  Administered 2016-10-30: 10 mg via INTRAVENOUS
  Filled 2016-10-30: qty 1

## 2016-10-30 MED ORDER — SODIUM CHLORIDE 0.9 % IV BOLUS (SEPSIS)
1000.0000 mL | Freq: Once | INTRAVENOUS | Status: AC
  Administered 2016-10-30: 1000 mL via INTRAVENOUS

## 2016-10-30 MED ORDER — LIDOCAINE-EPINEPHRINE 2 %-1:100000 IJ SOLN
10.0000 mL | Freq: Once | INTRAMUSCULAR | Status: AC
  Administered 2016-10-30: 10 mL via INTRADERMAL
  Filled 2016-10-30: qty 1

## 2016-10-30 MED ORDER — DIPHENHYDRAMINE HCL 50 MG/ML IJ SOLN
25.0000 mg | Freq: Once | INTRAMUSCULAR | Status: AC
  Administered 2016-10-30: 25 mg via INTRAVENOUS
  Filled 2016-10-30: qty 1

## 2016-10-30 NOTE — ED Notes (Signed)
ED Provider at bedside. 

## 2016-10-30 NOTE — ED Provider Notes (Signed)
CSF came back with 0 red blood cells. No evidence of subarachnoid hemorrhage. Patient has improvement of his headache but still has some mild ongoing symptoms. Recommending following up with his PCP   Blanchie Dessert, MD 10/30/16 1857

## 2016-10-30 NOTE — ED Triage Notes (Signed)
Pt reports headache for 2 days unrelieved by ibuprofen 800mg  and tylenol. States pain is "all over but began in the back of the head and moves around to the front" - associated light sensitivity denies nausea, vomiting.

## 2016-10-30 NOTE — ED Notes (Signed)
Consent signed given to secretary for scanning.

## 2016-10-30 NOTE — ED Provider Notes (Signed)
[facility] Provider Note   CSN: [id number] Arrival date & time: 10/30/16  1219     History   Chief Complaint Chief Complaint  Patient presents with  . Headache    HPI Harold Perez is a 46 y.o. male.  The history is provided by the patient.  Headache   This is a new (first occured 2 weeks ago while having intercourse) problem. The current episode started 12 to 24 hours ago (this episode started yesterday while having intercourse). The problem occurs constantly. The problem has been rapidly improving. The headache is associated with intercourse. The pain is located in the occipital region. The quality of the pain is described as sharp. The pain is moderate. The pain radiates to the face. Pertinent negatives include no fever, no malaise/fatigue, no syncope, no shortness of breath, no nausea and no vomiting. He has tried NSAIDs for the symptoms. The treatment provided mild relief.    Past Medical History:  Diagnosis Date  . Cancer [facility])    prostate- surgery only 2015  . GERD (gastroesophageal reflux disease)    OTC meds as needed  . H/O seasonal allergies     Patient Active Problem List   Diagnosis Date Noted  . Incontinence of urine 10/27/2015  . Malignant neoplasm of prostate ([place]) 11/13/2013  . Obesity (BMI 30-39.9) 08/02/2013  . DM2 (diabetes mellitus, type 2) ([place]) 08/02/2013  . Elevated PSA 08/02/2013  . Hyperlipidemia LDL goal < 100 08/02/2013  . GERD (gastroesophageal reflux disease) 07/25/2013  . Nocturia 07/25/2013  . Constipation 07/25/2013    Past Surgical History:  Procedure Laterality Date  . ROBOT ASSISTED LAPAROSCOPIC RADICAL PROSTATECTOMY N/A 11/13/2013   Procedure: ROBOTIC ASSISTED LAPAROSCOPIC RADICAL PROSTATECTOMY;  Surgeon: Bernestine Amass, MD;  Location: [facility] ORS;  Service: Urology;  Laterality: N/A;  . URETHRAL SLING N/A 10/27/2015   Procedure: CYSTOSCOPY AND MALE SLING;  Surgeon: Bjorn Loser, MD;  Location: [facility] ORS;  Service:  Urology;  Laterality: N/A;       Home Medications    Prior to Admission medications   Medication Sig Start Date End Date Taking? Authorizing Provider  Alprostadil (PROSTAGLANDIN E1) POWD by Does not apply route.   Yes Historical Provider, MD  Amino Acids-Protein Hydrolys (FEEDING SUPPLEMENT, PRO-STAT SUGAR FREE 64,) LIQD Take 30 mLs by mouth 3 (three) times daily with meals.    Historical Provider, MD  HYDROcodone-acetaminophen (NORCO) 5-325 MG tablet Take 1-2 tablets by mouth every 6 (six) hours as needed for moderate pain. 10/27/15   Debbrah Alar, PA-C    Family History Family History  Problem Relation Age of Onset  . Hypertension Mother   . Diabetes Mother   . Cancer Mother     breast  . Cancer Father     prostate  . Hypertension Father     Social History Social History  Substance Use Topics  . Smoking status: Never Smoker  . Smokeless tobacco: Never Used  . Alcohol use No     Allergies   Patient has no known allergies.   Review of Systems Review of Systems  Constitutional: Negative for fever and malaise/fatigue.  Respiratory: Negative for shortness of breath.   Cardiovascular: Negative for syncope.  Gastrointestinal: Negative for nausea and vomiting.  Neurological: Positive for headaches.   All other systems are reviewed and are negative for acute change except as noted in the HPI   Physical Exam Updated Vital Signs BP 118/74 (BP Location: Left Arm)   Pulse 75   Temp  98.2 F (36.8 C) (Oral)   Resp 16   Ht 6\' 2"  (1.88 m)   Wt 254 lb (115.2 kg)   SpO2 98%   BMI 32.61 kg/m   Physical Exam  Constitutional: He is oriented to person, place, and time. He appears well-developed and well-nourished. No distress.  HENT:  Head: Normocephalic and atraumatic.  Nose: Nose normal.  Eyes: Conjunctivae and EOM are normal. Pupils are equal, round, and reactive to light. Right eye exhibits no discharge. Left eye exhibits no discharge. No scleral icterus.  Neck:  Normal range of motion. Neck supple.  Cardiovascular: Normal rate and regular rhythm.  Exam reveals no gallop and no friction rub.   No murmur heard. Pulmonary/Chest: Effort normal and breath sounds normal. No stridor. No respiratory distress. He has no rales.  Abdominal: Soft. He exhibits no distension. There is no tenderness.  Musculoskeletal: He exhibits no edema or tenderness.  Neurological: He is alert and oriented to person, place, and time.  Mental Status: Alert and oriented to person, place, and time. Attention and concentration normal. Speech clear. Recent memory is intac  Cranial Nerves  II Visual Fields: Intact to confrontation. Visual fields intact. III, IV, VI: Pupils equal and reactive to light and near. Full eye movement without nystagmus  V Facial Sensation: Normal. No weakness of masticatory muscles  VII: No facial weakness or asymmetry  VIII Auditory Acuity: Grossly normal  IX/X: The uvula is midline; the palate elevates symmetrically  XI: Normal sternocleidomastoid and trapezius strength  XII: The tongue is midline. No atrophy or fasciculations.   Motor System: Muscle Strength: 5/5 and symmetric in the upper and lower extremities. No pronation or drift.  Muscle Tone: Tone and muscle bulk are normal in the upper and lower extremities.   Reflexes: DTRs: 2+ and symmetrical in all four extremities. Plantar responses are flexor bilaterally.  Coordination: Intact finger-to-nose, heel-to-shin. No tremor.  Sensation: Intact to light touch, and pinprick.  Gait: Routine gait normal     Skin: Skin is warm and dry. No rash noted. He is not diaphoretic. No erythema.  Psychiatric: He has a normal mood and affect.  Vitals reviewed.    ED Treatments / Results  Labs (all labs ordered are listed, but only abnormal results are displayed) Labs Reviewed  GRAM STAIN  CBC WITH DIFFERENTIAL/PLATELET  BASIC METABOLIC PANEL  CSF CELL COUNT WITH DIFFERENTIAL  CSF CELL COUNT WITH  DIFFERENTIAL    EKG  EKG Interpretation None       Radiology Ct Head Wo Contrast  Result Date: 10/30/2016 CLINICAL DATA:  Headache for the last 2 days.  Light sensitivity. EXAM: CT HEAD WITHOUT CONTRAST TECHNIQUE: Contiguous axial images were obtained from the base of the skull through the vertex without intravenous contrast. COMPARISON:  None. FINDINGS: Brain: No evidence of malformation, atrophy, old or acute small or large vessel infarction, mass lesion, hemorrhage, hydrocephalus or extra-axial collection. No evidence of pituitary lesion. Vascular: No abnormal vascular finding. Skull: Normal.  No fracture or focal bone lesion. Sinuses/Orbits: Mild mucosal thickening of the frontal, ethmoid and maxillary sinuses. Orbits negative. Other: None significant IMPRESSION: Normal intracranial exam. Mucosal inflammatory changes of paranasal sinuses. Electronically Signed   By: Nelson Chimes M.D.   On: 10/30/2016 13:36    Procedures .Lumbar Puncture Date/Time: 10/30/2016 3:30 PM Performed by: Fatima Blank Authorized by: Fatima Blank   Consent:    Consent obtained:  Written   Consent given by:  Patient   Risks discussed:  Infection, nerve damage, pain, headache and bleeding Pre-procedure details:    Procedure purpose:  Diagnostic   Preparation: Patient was prepped and draped in usual sterile fashion   Anesthesia (see MAR for exact dosages):    Anesthesia method:  Local infiltration   Local anesthetic:  Lidocaine 2% WITH epi Procedure details:    Lumbar space:  L3-L4 interspace   Patient position:  L lateral decubitus   Needle gauge:  18   Needle type:  Spinal needle - Quincke tip   Needle length (in):  3.5   Ultrasound guidance: no     Number of attempts:  1   Fluid appearance:  Clear   Tubes of fluid:  4   Total volume (ml):  6 Post-procedure:    Puncture site:  Adhesive bandage applied   Patient tolerance of procedure:  Tolerated well, no immediate  complications   (including critical care time)  Medications Ordered in ED Medications  lidocaine-EPINEPHrine (XYLOCAINE W/EPI) 2 %-1:100000 (with pres) injection 10 mL (not administered)  diphenhydrAMINE (BENADRYL) injection 25 mg (25 mg Intravenous Given 10/30/16 1557)  prochlorperazine (COMPAZINE) injection 10 mg (10 mg Intravenous Given 10/30/16 1557)  dexamethasone (DECADRON) injection 10 mg (10 mg Intravenous Given 10/30/16 1557)  sodium chloride 0.9 % bolus 1,000 mL (1,000 mLs Intravenous New Bag/Given 10/30/16 1556)     Initial Impression / Assessment and Plan / ED Course  I have reviewed the triage vital signs and the nursing notes.  Pertinent labs & imaging results that were available during my care of the patient were reviewed by me and considered in my medical decision making (see chart for details).     Sudden onset severe headache, post coitus. No fevers or infectious symptoms concerning for meningitis. Possible migraine however need to rule out subarachnoid hemorrhage. CT head without evidence of metastatic disease or SAH however he's more than 12 hours out. Will require lumbar puncture to assess CSF.  Patient provided with migraine cocktail in the interim.  Patient care turned over to Dr Maryan Rued at 1630. Patient case and results discussed in detail; please see their note for further ED managment.    Final Clinical Impressions(s) / ED Diagnoses   Final diagnoses:  Bad headache      Fatima Blank, MD 10/30/16 1640

## 2017-08-31 DIAGNOSIS — N393 Stress incontinence (female) (male): Secondary | ICD-10-CM | POA: Diagnosis not present

## 2017-08-31 DIAGNOSIS — C61 Malignant neoplasm of prostate: Secondary | ICD-10-CM | POA: Diagnosis not present

## 2018-01-11 ENCOUNTER — Ambulatory Visit: Payer: 59 | Admitting: Internal Medicine

## 2018-01-12 ENCOUNTER — Encounter: Payer: Self-pay | Admitting: Internal Medicine

## 2018-01-12 ENCOUNTER — Ambulatory Visit (INDEPENDENT_AMBULATORY_CARE_PROVIDER_SITE_OTHER): Payer: 59 | Admitting: Internal Medicine

## 2018-01-12 VITALS — BP 128/84 | HR 68 | Temp 97.9°F | Wt 269.0 lb

## 2018-01-12 DIAGNOSIS — J069 Acute upper respiratory infection, unspecified: Secondary | ICD-10-CM

## 2018-01-12 MED ORDER — AZITHROMYCIN 250 MG PO TABS: ORAL_TABLET | ORAL | 0 refills | Status: DC

## 2018-01-12 NOTE — Patient Instructions (Signed)
Upper Respiratory Infection, Adult Most upper respiratory infections (URIs) are a viral infection of the air passages leading to the lungs. A URI affects the nose, throat, and upper air passages. The most common type of URI is nasopharyngitis and is typically referred to as "the common cold." URIs run their course and usually go away on their own. Most of the time, a URI does not require medical attention, but sometimes a bacterial infection in the upper airways can follow a viral infection. This is called a secondary infection. Sinus and middle ear infections are common types of secondary upper respiratory infections. Bacterial pneumonia can also complicate a URI. A URI can worsen asthma and chronic obstructive pulmonary disease (COPD). Sometimes, these complications can require emergency medical care and may be life threatening. What are the causes? Almost all URIs are caused by viruses. A virus is a type of germ and can spread from one person to another. What increases the risk? You may be at risk for a URI if:  You smoke.  You have chronic heart or lung disease.  You have a weakened defense (immune) system.  You are very young or very old.  You have nasal allergies or asthma.  You work in crowded or poorly ventilated areas.  You work in health care facilities or schools.  What are the signs or symptoms? Symptoms typically develop 2-3 days after you come in contact with a cold virus. Most viral URIs last 7-10 days. However, viral URIs from the influenza virus (flu virus) can last 14-18 days and are typically more severe. Symptoms may include:  Runny or stuffy (congested) nose.  Sneezing.  Cough.  Sore throat.  Headache.  Fatigue.  Fever.  Loss of appetite.  Pain in your forehead, behind your eyes, and over your cheekbones (sinus pain).  Muscle aches.  How is this diagnosed? Your health care provider may diagnose a URI by:  Physical exam.  Tests to check that your  symptoms are not due to another condition such as: ? Strep throat. ? Sinusitis. ? Pneumonia. ? Asthma.  How is this treated? A URI goes away on its own with time. It cannot be cured with medicines, but medicines may be prescribed or recommended to relieve symptoms. Medicines may help:  Reduce your fever.  Reduce your cough.  Relieve nasal congestion.  Follow these instructions at home:  Take medicines only as directed by your health care provider.  Gargle warm saltwater or take cough drops to comfort your throat as directed by your health care provider.  Use a warm mist humidifier or inhale steam from a shower to increase air moisture. This may make it easier to breathe.  Drink enough fluid to keep your urine clear or pale yellow.  Eat soups and other clear broths and maintain good nutrition.  Rest as needed.  Return to work when your temperature has returned to normal or as your health care provider advises. You may need to stay home longer to avoid infecting others. You can also use a face mask and careful hand washing to prevent spread of the virus.  Increase the usage of your inhaler if you have asthma.  Do not use any tobacco products, including cigarettes, chewing tobacco, or electronic cigarettes. If you need help quitting, ask your health care provider. How is this prevented? The best way to protect yourself from getting a cold is to practice good hygiene.  Avoid oral or hand contact with people with cold symptoms.  Wash your   hands often if contact occurs.  There is no clear evidence that vitamin C, vitamin E, echinacea, or exercise reduces the chance of developing a cold. However, it is always recommended to get plenty of rest, exercise, and practice good nutrition. Contact a health care provider if:  You are getting worse rather than better.  Your symptoms are not controlled by medicine.  You have chills.  You have worsening shortness of breath.  You have  brown or red mucus.  You have yellow or brown nasal discharge.  You have pain in your face, especially when you bend forward.  You have a fever.  You have swollen neck glands.  You have pain while swallowing.  You have white areas in the back of your throat. Get help right away if:  You have severe or persistent: ? Headache. ? Ear pain. ? Sinus pain. ? Chest pain.  You have chronic lung disease and any of the following: ? Wheezing. ? Prolonged cough. ? Coughing up blood. ? A change in your usual mucus.  You have a stiff neck.  You have changes in your: ? Vision. ? Hearing. ? Thinking. ? Mood. This information is not intended to replace advice given to you by your health care provider. Make sure you discuss any questions you have with your health care provider. Document Released: 12/14/2000 Document Revised: 02/21/2016 Document Reviewed: 09/25/2013 Elsevier Interactive Patient Education  2018 [employer]vier Inc[employer]  

## 2018-01-12 NOTE — Progress Notes (Signed)
Subjective:    Patient ID: Harold Perez, male    DOB: 30-Aug-1970, 47 y.o.   MRN: [id number]  HPI  Pt presents to the clinic today with c/o itchy, watery eyes and nasal congestion. This started 2 weeks ago. He has a small bump on his left eye. He reports he is blowing green mucous out of his nose. He denies ear pain, sore throat, cough or chest congestion. He denies fever, chills or body aches. He has tried Claritin, Sudafed, Nyquil and Saline Rinse with minimal relief. He has no history of allergies. He has not had sick contacts but did recently travel on a plane.  Review of Systems      Past Medical History:  Diagnosis Date  . Cancer [facility])    prostate- surgery only 2015  . GERD (gastroesophageal reflux disease)    OTC meds as needed  . H/O seasonal allergies     Current Outpatient Medications  Medication Sig Dispense Refill  . Alprostadil (PROSTAGLANDIN E1) POWD by Does not apply route.     No current facility-administered medications for this visit.     No Known Allergies  Family History  Problem Relation Age of Onset  . Hypertension Mother   . Diabetes Mother   . Cancer Mother        breast  . Cancer Father        prostate  . Hypertension Father     Social History   Socioeconomic History  . Marital status: Married    Spouse name: Not on file  . Number of children: Not on file  . Years of education: Not on file  . Highest education level: Not on file  Occupational History  . Not on file  Social Needs  . Financial resource strain: Not on file  . Food insecurity:    Worry: Not on file    Inability: Not on file  . Transportation needs:    Medical: Not on file    Non-medical: Not on file  Tobacco Use  . Smoking status: Never Smoker  . Smokeless tobacco: Never Used  Substance and Sexual Activity  . Alcohol use: No  . Drug use: No  . Sexual activity: Not on file  Lifestyle  . Physical activity:    Days per week: Not on file    Minutes per session:  Not on file  . Stress: Not on file  Relationships  . Social connections:    Talks on phone: Not on file    Gets together: Not on file    Attends religious service: Not on file    Active member of club or organization: Not on file    Attends meetings of clubs or organizations: Not on file    Relationship status: Not on file  . Intimate partner violence:    Fear of current or ex partner: Not on file    Emotionally abused: Not on file    Physically abused: Not on file    Forced sexual activity: Not on file  Other Topics Concern  . Not on file  Social History Narrative  . Not on file     Constitutional: Denies fever, malaise, fatigue, headache or abrupt weight changes.  HEENT: Pt reports watery eyes, nasal congestion, bump on eye lid. Denies eye pain, eye redness, ear pain, ringing in the ears, wax buildup, runny nose,  bloody nose, or sore throat. Respiratory: Denies difficulty breathing, shortness of breath, cough or sputum production.    No other  specific complaints in a complete review of systems (except as listed in HPI above).  Objective:   Physical Exam   BP 128/84   Pulse 68   Temp 97.9 F (36.6 C) (Oral)   Wt 269 lb (122 kg)   SpO2 98%   BMI 34.54 kg/m  Wt Readings from Last 3 Encounters:  01/12/18 269 lb (122 kg)  10/30/16 254 lb (115.2 kg)  10/27/15 259 lb (117.5 kg)    General: Appears his stated age, obese in NAD. Skin: Dry and intact. HEENT: Head: normal shape and size, no sinus tenderness noted; Eyes: sclera white, no icterus, conjunctiva pink, PERRLA and EOMs intact, stye to left lower lid; Ears: Tm's gray and intact, normal light reflex; Nose: mucosa boggy and moist, turbinates; Throat/Mouth: Teeth present, mucosa erythematous and moist, + PND, no exudate, lesions or ulcerations noted.  Neck:  No adenopathy noted. Pulmonary/Chest: Normal effort and positive vesicular breath sounds. No respiratory distress. No wheezes, rales or ronchi noted.  Neuro:  Alert and oriented.  BMET    Component Value Date/Time   NA 135 10/28/2015 0506   K 4.2 10/28/2015 0506   CL 101 10/28/2015 0506   CO2 25 10/28/2015 0506   GLUCOSE 139 (H) 10/28/2015 0506   BUN 12 10/28/2015 0506   CREATININE 1.09 10/28/2015 0506   CALCIUM 9.1 10/28/2015 0506   GFRNONAA >60 10/28/2015 0506   GFRAA >60 10/28/2015 0506    Lipid Panel     Component Value Date/Time   CHOL 214 (H) 07/25/2013 1012   TRIG 107.0 07/25/2013 1012   HDL 41.20 07/25/2013 1012   CHOLHDL 5 07/25/2013 1012   VLDL 21.4 07/25/2013 1012    CBC    Component Value Date/Time   WBC 5.7 10/26/2015 0935   RBC 4.84 10/26/2015 0935   HGB 12.7 (L) 10/28/2015 0506   HCT 38.7 (L) 10/28/2015 0506   PLT 187 10/26/2015 0935   MCV 85.1 10/26/2015 0935   MCH 27.7 10/26/2015 0935   MCHC 32.5 10/26/2015 0935   RDW 14.6 10/26/2015 0935    Hgb A1C Lab Results  Component Value Date   HGBA1C 7.0 (H) 07/25/2013           Assessment & Plan:   Upper Respiratory Infection:  Encouraged him to get some rest and drink plenty of fluids Continue Claritin and Flonase eRx for Azithromax 250 mg x 6 days Delsym as needed for cough  Return precautions discussed Webb Silversmith, NP

## 2018-02-19 ENCOUNTER — Ambulatory Visit: Payer: 59 | Admitting: Internal Medicine

## 2018-02-19 DIAGNOSIS — Z0289 Encounter for other administrative examinations: Secondary | ICD-10-CM

## 2018-04-14 DIAGNOSIS — Z23 Encounter for immunization: Secondary | ICD-10-CM | POA: Diagnosis not present

## 2018-04-16 ENCOUNTER — Ambulatory Visit: Payer: 59 | Admitting: Family Medicine

## 2018-06-15 DIAGNOSIS — M25562 Pain in left knee: Secondary | ICD-10-CM | POA: Diagnosis not present

## 2018-07-08 ENCOUNTER — Other Ambulatory Visit: Payer: Self-pay

## 2018-07-08 ENCOUNTER — Encounter (HOSPITAL_COMMUNITY): Payer: Self-pay | Admitting: *Deleted

## 2018-07-08 ENCOUNTER — Emergency Department (HOSPITAL_COMMUNITY): Payer: 59

## 2018-07-08 ENCOUNTER — Emergency Department (HOSPITAL_COMMUNITY)
Admission: EM | Admit: 2018-07-08 | Discharge: 2018-07-09 | Disposition: A | Discharge status: Home / Self Care | Payer: 59 | Attending: Emergency Medicine | Admitting: Emergency Medicine

## 2018-07-08 DIAGNOSIS — K429 Umbilical hernia without obstruction or gangrene: Secondary | ICD-10-CM | POA: Diagnosis not present

## 2018-07-08 DIAGNOSIS — R109 Unspecified abdominal pain: Secondary | ICD-10-CM | POA: Diagnosis not present

## 2018-07-08 DIAGNOSIS — R1033 Periumbilical pain: Secondary | ICD-10-CM | POA: Diagnosis not present

## 2018-07-08 LAB — COMPREHENSIVE METABOLIC PANEL
ALT: 28 U/L (ref 0–44)
AST: 29 U/L (ref 15–41)
Albumin: 4 g/dL (ref 3.5–5.0)
Alkaline Phosphatase: 71 U/L (ref 38–126)
Anion gap: 7 (ref 5–15)
BUN: 17 mg/dL (ref 6–20)
CO2: 26 mmol/L (ref 22–32)
CREATININE: 1.24 mg/dL (ref 0.61–1.24)
Calcium: 9.3 mg/dL (ref 8.9–10.3)
Chloride: 104 mmol/L (ref 98–111)
GFR calc non Af Amer: >60 mL/min (ref >60)
Glucose, Bld: 89 mg/dL (ref 70–99)
Potassium: 4.2 mmol/L (ref 3.5–5.1)
SODIUM: 137 mmol/L (ref 135–145)
Total Bilirubin: 1.1 mg/dL (ref 0.3–1.2)
Total Protein: 8.1 g/dL (ref 6.5–8.1)

## 2018-07-08 LAB — CBC WITH DIFFERENTIAL/PLATELET
Abs Immature Granulocytes: 0.01 10*3/uL (ref 0.00–0.07)
Basophils Absolute: 0.1 10*3/uL (ref 0.0–0.1)
Basophils Relative: 1 %
Eosinophils Absolute: 0.3 10*3/uL (ref 0.0–0.5)
Eosinophils Relative: 4 %
HCT: 39.9 % (ref 39.0–52.0)
Hemoglobin: 12.7 g/dL — ABNORMAL LOW (ref 13.0–17.0)
Immature Granulocytes: 0 %
Lymphocytes Relative: 38 %
Lymphs Abs: 3 10*3/uL (ref 0.7–4.0)
MCH: 28.2 pg (ref 26.0–34.0)
MCHC: 31.8 g/dL (ref 30.0–36.0)
MCV: 88.5 fL (ref 80.0–100.0)
Monocytes Absolute: 0.7 10*3/uL (ref 0.1–1.0)
Monocytes Relative: 9 %
Neutro Abs: 3.9 10*3/uL (ref 1.7–7.7)
Neutrophils Relative %: 48 %
Platelets: 138 10*3/uL — ABNORMAL LOW (ref 150–400)
RBC: 4.51 MIL/uL (ref 4.22–5.81)
RDW: 14.6 % (ref 11.5–15.5)
Smear Review: UNDETERMINED
WBC: 7.9 10*3/uL (ref 4.0–10.5)
nRBC: 0 % (ref 0.0–0.2)

## 2018-07-08 LAB — LIPASE, BLOOD: Lipase: 41 U/L (ref 11–51)

## 2018-07-08 MED ORDER — IOPAMIDOL (ISOVUE-300) INJECTION 61%
INTRAVENOUS | Status: AC
  Filled 2018-07-08: qty 100

## 2018-07-08 MED ORDER — SODIUM CHLORIDE (PF) 0.9 % IJ SOLN
INTRAMUSCULAR | Status: AC
  Administered 2018-07-08
  Filled 2018-07-08: qty 50

## 2018-07-08 MED ORDER — IOPAMIDOL (ISOVUE-300) INJECTION 61%
100.0000 mL | Freq: Once | INTRAVENOUS | Status: AC | PRN
  Administered 2018-07-08: 100 mL via INTRAVENOUS

## 2018-07-08 NOTE — ED Notes (Signed)
Patient transported to CT 

## 2018-07-08 NOTE — Discharge Instructions (Addendum)
Evaluated today for abdominal pain.  You do have fat-containing hernias on your CT scan.  Your lab work was negative. Please follow up with the CT findings below with your PCP.  IMPRESSION: Small supraumbilical ventral hernia at midline containing fat, with fascial defect 13 x 16 mm. Tiny umbilical hernia containing fat. Cystic structure with calcified wall anterior to the upper to mid urinary bladder measuring 3.3 x 4.1 x 3.9 cm in size, question calcified urachal cyst, less likely bladder diverticulum or sequela of contained bladder perforation; recommend follow-up ultrasound assessment.

## 2018-07-08 NOTE — ED Triage Notes (Signed)
Pt has abd pain related to ? Hernia near umbilical area, he has been able to press back in, today too painful to press on.

## 2018-07-08 NOTE — ED Notes (Signed)
Attempted IV access x2 and was not successful. Blood sent to lab. Another RN will attempt IV access.

## 2018-07-08 NOTE — ED Provider Notes (Signed)
[facility] DEPT Provider Note   CSN: [id number] Arrival date & time: 07/08/18  1755   History   Chief Complaint Chief Complaint  Patient presents with  . Hernia    HPI Harold Perez is a 48 y.o. male with past medical history significant for prostate cancer s/p prostatectomy in 2015, GERD, DM, chronic constipation who presents for evaluation of abdominal pain.  Patient states he has had periumbilical abdominal pain, onset this morning.  Patient states he has a known ventral and umbilical hernia.  Denies fever, chills, nausea, vomiting, chest pain, shortness of breath, diarrhea, constipation, dysuria.  Last p.o. intake 11 AM.  Patients last bowel movement this morning which he describes as "normal."  No prior abdominal surgeries.  Patient states he has pain which he rates a 4/10.  States his pain is intermittent and "only when I touch my stomach."  Pain does not radiate.  No prior history of bowel obstructions or incarcerated hernia. Has not taken anything for his symptoms PTA  History obtained from patient.  No interpreter was used.   HPI  Past Medical History:  Diagnosis Date  . Cancer [facility])    prostate- surgery only 2015  . GERD (gastroesophageal reflux disease)    OTC meds as needed  . H/O seasonal allergies     Patient Active Problem List   Diagnosis Date Noted  . Incontinence of urine 10/27/2015  . Malignant neoplasm of prostate ([place]) 11/13/2013  . Obesity (BMI 30-39.9) 08/02/2013  . DM2 (diabetes mellitus, type 2) ([place]) 08/02/2013  . Elevated PSA 08/02/2013  . Hyperlipidemia LDL goal < 100 08/02/2013  . GERD (gastroesophageal reflux disease) 07/25/2013  . Nocturia 07/25/2013  . Constipation 07/25/2013    Past Surgical History:  Procedure Laterality Date  . ROBOT ASSISTED LAPAROSCOPIC RADICAL PROSTATECTOMY N/A 11/13/2013   Procedure: ROBOTIC ASSISTED LAPAROSCOPIC RADICAL PROSTATECTOMY;  Surgeon: Bernestine Amass, MD;  Location: [facility] ORS;   Service: Urology;  Laterality: N/A;  . URETHRAL SLING N/A 10/27/2015   Procedure: CYSTOSCOPY AND MALE SLING;  Surgeon: Bjorn Loser, MD;  Location: [facility] ORS;  Service: Urology;  Laterality: N/A;        Home Medications    Prior to Admission medications   Medication Sig Start Date End Date Taking? Authorizing Provider  ibuprofen (ADVIL,MOTRIN) 200 MG tablet Take 400 mg by mouth every 6 (six) hours as needed for headache, mild pain or moderate pain.   Yes [provider]  Multiple Vitamins-Minerals (MULTIVITAMIN ADULTS PO) Take 1 tablet by mouth daily.   Yes [provider]  azithromycin (ZITHROMAX) 250 MG tablet Take 2 tabs today, then 1 tab daily x 4 days Patient not taking: Reported on 07/08/2018 01/12/18   Jearld Fenton, NP  loratadine (CLARITIN) 10 MG tablet Take 10 mg by mouth daily as needed for allergies.  01/12/18  [provider]    Family History Family History  Problem Relation Age of Onset  . Hypertension Mother   . Diabetes Mother   . Cancer Mother        breast  . Cancer Father        prostate  . Hypertension Father     Social History Social History   Tobacco Use  . Smoking status: Never Smoker  . Smokeless tobacco: Never Used  Substance Use Topics  . Alcohol use: No  . Drug use: No     Allergies   Patient has no known allergies.   Review of Systems Review  of Systems  Constitutional: Negative.   HENT: Negative.   Respiratory: Negative.   Cardiovascular: Negative.   Gastrointestinal: Positive for abdominal pain. Negative for abdominal distention, anal bleeding, blood in stool, constipation, diarrhea, nausea, rectal pain and vomiting.  Genitourinary: Negative.   Musculoskeletal: Negative.   Skin: Negative.   Neurological: Negative.   All other systems reviewed and are negative.    Physical Exam Updated Vital Signs BP 136/89 (BP Location: Right Arm)   Pulse (!) 59   Temp 98 F (36.7 C)   Resp 19   Ht 6\' 2"  (1.88  m)   Wt 113.4 kg   SpO2 97%   BMI 32.10 kg/m   Physical Exam Vitals signs and nursing note reviewed.  Constitutional:      General: He is not in acute distress.    Appearance: Normal appearance. He is well-developed. He is not ill-appearing, toxic-appearing or diaphoretic.  HENT:     Head: Normocephalic and atraumatic.     Nose: Nose normal. No congestion or rhinorrhea.     Mouth/Throat:     Mouth: Mucous membranes are moist.     Pharynx: Oropharynx is clear.  Eyes:     Pupils: Pupils are equal, round, and reactive to light.  Neck:     Musculoskeletal: Normal range of motion and neck supple.  Cardiovascular:     Rate and Rhythm: Normal rate and regular rhythm.     Pulses: Normal pulses.     Heart sounds: Normal heart sounds. No murmur. No friction rub. No gallop.   Pulmonary:     Effort: Pulmonary effort is normal. No respiratory distress.     Breath sounds: Normal breath sounds. No stridor. No wheezing or rhonchi.     Comments: Clear to auscultation bilaterally without wheeze, rhonchi or rales.  No signs of acute respiratory distress.  No accessory muscle usage. Chest:     Chest wall: No tenderness.  Abdominal:     General: Bowel sounds are normal. There is no distension.     Palpations: Abdomen is soft.     Tenderness: There is abdominal tenderness in the periumbilical area. There is guarding. There is no rebound. Negative signs include Rovsing's sign, McBurney's sign, psoas sign and obturator sign.     Comments: Abdomen with tenderness to periumbilical region.  There is guarding.  No rebound.  Small what appears to be ventral hernia, superior to umbilicus.  Able to be reduced.  Normoactive bowel sounds.  Musculoskeletal: Normal range of motion.     Comments: Moves all extremities without difficulty.  Skin:    General: Skin is warm and dry.     Findings: No rash.     Comments: No rashes or lesions.  Neurological:     Mental Status: He is alert.     Motor: No weakness.       ED Treatments / Results  Labs (all labs ordered are listed, but only abnormal results are displayed) Labs Reviewed  CBC WITH DIFFERENTIAL/PLATELET - Abnormal; Notable for the following components:      Result Value   Hemoglobin 12.7 (*)    Platelets 138 (*)    All other components within normal limits  COMPREHENSIVE METABOLIC PANEL  LIPASE, BLOOD  URINALYSIS, ROUTINE W REFLEX MICROSCOPIC    EKG None  Radiology Ct Abdomen Pelvis W Contrast  Result Date: 07/08/2018 CLINICAL DATA:  Upper abdominal pain, nausea, question hernia, history GERD, prostate cancer EXAM: CT ABDOMEN AND PELVIS WITH CONTRAST TECHNIQUE: Multidetector CT imaging  of the abdomen and pelvis was performed using the standard protocol following bolus administration of intravenous contrast. Sagittal and coronal MPR images reconstructed from axial data set. CONTRAST:  133mL ISOVUE-300 IOPAMIDOL (ISOVUE-300) INJECTION 61% IV. No oral contrast. COMPARISON:  None FINDINGS: Lower chest: Lung bases clear Hepatobiliary: Gallbladder and liver normal appearance Pancreas: Normal appearance Spleen: Normal appearance Adrenals/Urinary Tract: Adrenal glands, kidneys, and ureters normal appearance. Cystic structure with a partially calcified wall identified anterior to the upper to mid urinary bladder, 3.3 x 4.1 x 3.9 cm in size, slightly indenting the bladder wall. This appears extrinsic to the urinary bladder. Potentially this could represent a calcified urachal cyst. Bladder diverticulum and contain bladder perforation with wall calcification are considered less likely etiologies. Remainder of bladder normal appearance. Stomach/Bowel: Normal appendix. Stomach and bowel loops normal appearance. Vascular/Lymphatic: Vascular structures patent.  No adenopathy. Reproductive: Post prostatectomy. Nonvisualization of seminal vesicles. Other: Tiny umbilical hernia containing fat. Small supraumbilical ventral hernia at midline containing fat,  fascial defect 13 x 16 mm. No inguinal hernias. No free intraperitoneal air or fluid. Musculoskeletal: Unremarkable IMPRESSION: Small supraumbilical ventral hernia at midline containing fat, with fascial defect 13 x 16 mm. Tiny umbilical hernia containing fat. Cystic structure with calcified wall anterior to the upper to mid urinary bladder measuring 3.3 x 4.1 x 3.9 cm in size, question calcified urachal cyst, less likely bladder diverticulum or sequela of contained bladder perforation; recommend follow-up ultrasound assessment. Electronically Signed   By: Lavonia Dana M.D.   On: 07/08/2018 23:45    Procedures Procedures (including critical care time)  Medications Ordered in ED Medications  iopamidol (ISOVUE-300) 61 % injection 100 mL (100 mLs Intravenous Contrast Given 07/08/18 2316)  sodium chloride (PF) 0.9 % injection (  Given by Other 07/08/18 2336)     Initial Impression / Assessment and Plan / ED Course  I have reviewed the triage vital signs and the nursing notes.  Pertinent labs & imaging results that were available during my care of the patient were reviewed by me and considered in my medical decision making (see chart for details).  48 year old male who appears otherwise well presents for evaluation of abdominal pain.  Onset this morning.  Patient does have a known ventral.  Afebrile, nonseptic, non-ill-appearing. No nausea, vomiting, diarrhea or constipation.  Last bowel movement this morning.  Last p.o. intake at 11 AM.  No previous abdominal surgeries.  Patient does have a small what appears to be a ventral hernia.  Tenderness to this area.  Mild guarding. No rebound.  Will obtain labs, CT and reevaluate.  Does not want anything for pain at this time.  Lipase 41, metabolic panel without any evidence of electrolyte, renal or liver abnormalities, CBC without leukocytosis, hemoglobin 12.7, previous hemoglobin 12.7.  Patient does not want to provide urinalysis at this time.  CT scan negative  for incarcerated or strangulated hernia.  There is a possible structure in bladder.  Recommendation for follow-up ultrasound.  Patient is urinating without difficulty.  Patient requesting DC home at this time.  Discussed follow-up with primary care provider for reevaluation of possible cystic structure.  Able to tolerate PO intake in department without difficulty.  Patient is nontoxic, nonseptic appearing, in no apparent distress.  Patient does not meet the SIRS or Sepsis criteria.  On repeat exam patient does not have a surgical abdomin and there are no peritoneal signs.  No indication of appendicitis, bowel obstruction, bowel perforation, cholecystitis, diverticulitis, .  Patient discharged home with  symptomatic treatment and given strict instructions for follow-up with their primary care physician.  Patient is hemodynamically stable and appropriate for DC home at this time.  Discussed strict return precautions.  Patient voiced understanding and is agreeable for follow-up.    Final Clinical Impressions(s) / ED Diagnoses   Final diagnoses:  Periumbilical abdominal pain    ED Discharge Orders    None       ,  A, PA-C 07/09/18 0013    Carmin Muskrat, MD 07/09/18 [phone]

## 2018-07-09 ENCOUNTER — Telehealth: Payer: Self-pay

## 2018-07-09 NOTE — Telephone Encounter (Signed)
[facility] faxed note 07/08/18 pt having abd pain, tender to touch,difficult to walk/move. Pain rt of naval, feels like an "air pocket" when mash on area it goes down but comes right back up. No hot or red. Feels like he has pulled muscle. No N/V/D or fever. Pt was having bubble in stomach couple of months ago, not painful; went to UC and was told nothing wrong.In last couple of weeks area has become sore/painful. Per chart review tab pt was seen [facility] 07/08/18.

## 2018-07-09 NOTE — Telephone Encounter (Signed)
He should make a hospital followup

## 2018-07-11 ENCOUNTER — Ambulatory Visit: Payer: 59 | Admitting: Internal Medicine

## 2018-07-11 NOTE — Telephone Encounter (Signed)
Pt has hospital f/u scheduled for 07/12/2018

## 2018-07-12 ENCOUNTER — Ambulatory Visit: Payer: 59 | Admitting: Internal Medicine

## 2018-07-16 ENCOUNTER — Ambulatory Visit: Payer: 59 | Admitting: Internal Medicine

## 2018-07-16 DIAGNOSIS — Z0289 Encounter for other administrative examinations: Secondary | ICD-10-CM

## 2018-07-16 NOTE — Progress Notes (Deleted)
   Subjective:    Patient ID: Harold Perez, male    DOB: 1971-04-17, 48 y.o.   MRN: [id number]  HPI  Patient presents to the clinic today for ER follow-up.  He went to the ER 1/5, with complaints of peri-umbilical abdominal pain.  ECG and labs were unremarkable.  CT scan showed:  IMPRESSION: Small supraumbilical ventral hernia at midline containing fat, with fascial defect 13 x 16 mm.   Tiny umbilical hernia containing fat.   Cystic structure with calcified wall anterior to the upper to mid urinary bladder measuring 3.3 x 4.1 x 3.9 cm in size, question calcified urachal cyst, less likely bladder diverticulum or sequela of contained bladder perforation; recommend follow-up ultrasound assessment.  Because his pain was minimal and his bowels were moving normally, he was advised to follow-up with his PCP as an outpatient.  The radiologist did recommend follow-up ultrasound for further evaluation of the cystic structure of the bladder.  Review of Systems     Objective:   Physical Exam        Assessment & Plan:   ER Follow Up for Periumbical Pain, Ventral Hernia, Umbilical Hernia, Cystic Mass of Bladder:  ER notes, labs and imaging reviewed Patient is not in any pain at this time No intervention needed at this time for ventral or umbilical hernia-red flags discussed We will obtain ultrasound of bladder for further evaluation of cystic mass  Return precautions discussed, we will follow-up after imaging results available Webb Silversmith, NP

## 2018-08-07 ENCOUNTER — Ambulatory Visit (INDEPENDENT_AMBULATORY_CARE_PROVIDER_SITE_OTHER): Payer: 59 | Admitting: Internal Medicine

## 2018-08-07 ENCOUNTER — Encounter: Payer: Self-pay | Admitting: Internal Medicine

## 2018-08-07 VITALS — BP 130/82 | HR 82 | Temp 98.3°F | Wt 273.0 lb

## 2018-08-07 DIAGNOSIS — H9202 Otalgia, left ear: Secondary | ICD-10-CM

## 2018-08-07 DIAGNOSIS — R0981 Nasal congestion: Secondary | ICD-10-CM | POA: Diagnosis not present

## 2018-08-08 ENCOUNTER — Encounter: Payer: Self-pay | Admitting: Internal Medicine

## 2018-08-08 MED ORDER — METHYLPREDNISOLONE ACETATE 80 MG/ML IJ SUSP: 80.0000 mg | Freq: Once | INTRAMUSCULAR | Status: AC

## 2018-08-08 NOTE — Progress Notes (Signed)
Subjective:    Patient ID: Harold Perez, male    DOB: 1971/03/22, 48 y.o.   MRN: [id number]  HPI  Pt presents to the clinic today with c/o nasal congestion and ear pain. He reports this started 2 weeks ago. He is not blowing much out of her nose. He describes the ear pain as sharp and stabbing. He denies ear drainage or loss of hearing. He denies runny nose, sore throat or cough. He denies fever, chills or body aches. He has tried Allegra D, Nyquil, Flonase and Sudafed with minimal relief. He has not had sick contacts.  Review of Systems      Past Medical History:  Diagnosis Date  . Cancer [facility])    prostate- surgery only 2015  . GERD (gastroesophageal reflux disease)    OTC meds as needed  . H/O seasonal allergies     No current outpatient medications on file.   No current facility-administered medications for this visit.     No Known Allergies  Family History  Problem Relation Age of Onset  . Hypertension Mother   . Diabetes Mother   . Cancer Mother        breast  . Cancer Father        prostate  . Hypertension Father     Social History   Socioeconomic History  . Marital status: Married    Spouse name: Not on file  . Number of children: Not on file  . Years of education: Not on file  . Highest education level: Not on file  Occupational History  . Not on file  Social Needs  . Financial resource strain: Not on file  . Food insecurity:    Worry: Not on file    Inability: Not on file  . Transportation needs:    Medical: Not on file    Non-medical: Not on file  Tobacco Use  . Smoking status: Never Smoker  . Smokeless tobacco: Never Used  Substance and Sexual Activity  . Alcohol use: No  . Drug use: No  . Sexual activity: Not on file  Lifestyle  . Physical activity:    Days per week: Not on file    Minutes per session: Not on file  . Stress: Not on file  Relationships  . Social connections:    Talks on phone: Not on file    Gets together: Not on  file    Attends religious service: Not on file    Active member of club or organization: Not on file    Attends meetings of clubs or organizations: Not on file    Relationship status: Not on file  . Intimate partner violence:    Fear of current or ex partner: Not on file    Emotionally abused: Not on file    Physically abused: Not on file    Forced sexual activity: Not on file  Other Topics Concern  . Not on file  Social History Narrative  . Not on file     Constitutional: Denies fever, malaise, fatigue, headache or abrupt weight changes.  HEENT: Pt reports nasal congestion and ear pain. Denies eye pain, eye redness,  ringing in the ears, wax buildup, runny nose, bloody nose, or sore throat. Respiratory: Denies difficulty breathing, shortness of breath, cough or sputum production.   Cardiovascular: Denies chest pain, chest tightness, palpitations or swelling in the hands or feet.   No other specific complaints in a complete review of systems (except as listed in HPI  above).  Objective:   Physical Exam   BP 130/82   Pulse 82   Temp 98.3 F (36.8 C) (Oral)   Wt 273 lb (123.8 kg)   SpO2 98%   BMI 35.05 kg/m  Wt Readings from Last 3 Encounters:  08/07/18 273 lb (123.8 kg)  07/08/18 250 lb (113.4 kg)  01/12/18 269 lb (122 kg)    General: Appears his stated age, well developed, well nourished in NAD. HEENT: Head: normal shape and size, no sinus tenderness noted; ; Ears: Tm's gray and intact, normal light reflex; Nose: mucosa boggy and dry, turbinates swollen, septum midline; Throat/Mouth: Teeth present, mucosa pink and moist, no exudate, lesions or ulcerations noted.  Neck:  No adenopathy noted. Cardiovascular: Normal rate and rhythm. S1,S2 noted.  No murmur, rubs or gallops noted.  Pulmonary/Chest: Normal effort and positive vesicular breath sounds. No respiratory distress. No wheezes, rales or ronchi noted.   BMET    Component Value Date/Time   NA 137 07/08/2018 2015    K 4.2 07/08/2018 2015   CL 104 07/08/2018 2015   CO2 26 07/08/2018 2015   GLUCOSE 89 07/08/2018 2015   BUN 17 07/08/2018 2015   CREATININE 1.24 07/08/2018 2015   CALCIUM 9.3 07/08/2018 2015   GFRNONAA >60 07/08/2018 2015   GFRAA >60 07/08/2018 2015    Lipid Panel     Component Value Date/Time   CHOL 214 (H) 07/25/2013 1012   TRIG 107.0 07/25/2013 1012   HDL 41.20 07/25/2013 1012   CHOLHDL 5 07/25/2013 1012   VLDL 21.4 07/25/2013 1012    CBC    Component Value Date/Time   WBC 7.9 07/08/2018 2015   RBC 4.51 07/08/2018 2015   HGB 12.7 (L) 07/08/2018 2015   HCT 39.9 07/08/2018 2015   PLT 138 (L) 07/08/2018 2015   MCV 88.5 07/08/2018 2015   MCH 28.2 07/08/2018 2015   MCHC 31.8 07/08/2018 2015   RDW 14.6 07/08/2018 2015   LYMPHSABS 3.0 07/08/2018 2015   MONOABS 0.7 07/08/2018 2015   EOSABS 0.3 07/08/2018 2015   BASOSABS 0.1 07/08/2018 2015    Hgb A1C Lab Results  Component Value Date   HGBA1C 7.0 (H) 07/25/2013           Assessment & Plan:   Nasal Congestion, Left Otalgia:  Likely allergies 80 mg Depo IM today Flonase 1 spray each nostril daily Saline nasal spray 2 sprays each nostril BID  Return precautions discussed Webb Silversmith, NP

## 2018-08-08 NOTE — Patient Instructions (Signed)

## 2018-09-19 ENCOUNTER — Emergency Department (HOSPITAL_BASED_OUTPATIENT_CLINIC_OR_DEPARTMENT_OTHER)
Admission: EM | Admit: 2018-09-19 | Discharge: 2018-09-19 | Disposition: A | Discharge status: Home / Self Care | Payer: 59 | Attending: Emergency Medicine | Admitting: Emergency Medicine

## 2018-09-19 ENCOUNTER — Other Ambulatory Visit: Payer: Self-pay

## 2018-09-19 ENCOUNTER — Encounter (HOSPITAL_BASED_OUTPATIENT_CLINIC_OR_DEPARTMENT_OTHER): Payer: Self-pay | Admitting: *Deleted

## 2018-09-19 DIAGNOSIS — L03032 Cellulitis of left toe: Secondary | ICD-10-CM | POA: Insufficient documentation

## 2018-09-19 DIAGNOSIS — Z8546 Personal history of malignant neoplasm of prostate: Secondary | ICD-10-CM | POA: Diagnosis not present

## 2018-09-19 DIAGNOSIS — E119 Type 2 diabetes mellitus without complications: Secondary | ICD-10-CM | POA: Diagnosis not present

## 2018-09-19 DIAGNOSIS — M79675 Pain in left toe(s): Secondary | ICD-10-CM | POA: Diagnosis present

## 2018-09-19 MED ORDER — DOXYCYCLINE HYCLATE 100 MG PO CAPS: 100.0000 mg | ORAL_CAPSULE | Freq: Two times a day (BID) | ORAL | 0 refills | Status: AC

## 2018-09-19 NOTE — ED Provider Notes (Signed)
[facility] Provider Note   CSN: [id number] Arrival date & time: 09/19/18  8127    History   Chief Complaint Chief Complaint  Patient presents with  . Toe Pain    HPI Harold Perez is a 48 y.o. male.     HPI   48yo male with history of hyperlipidemia, GERD presents with concern for left great toe pain.  Had pedicure on Friday and Saturday began to develop pain which has progressed since then.  Notes redness, severe throbbing pain. Worse with palpation and ambulation.  No fevers, no nausea or vomiting or other concerns.  Denies hx of DM.  No drainage.   Past Medical History:  Diagnosis Date  . Cancer [facility])    prostate- surgery only 2015  . GERD (gastroesophageal reflux disease)    OTC meds as needed  . H/O seasonal allergies     Patient Active Problem List   Diagnosis Date Noted  . Incontinence of urine 10/27/2015  . Malignant neoplasm of prostate ([place]) 11/13/2013  . Obesity (BMI 30-39.9) 08/02/2013  . DM2 (diabetes mellitus, type 2) ([place]) 08/02/2013  . Elevated PSA 08/02/2013  . Hyperlipidemia LDL goal < 100 08/02/2013  . GERD (gastroesophageal reflux disease) 07/25/2013  . Nocturia 07/25/2013  . Constipation 07/25/2013    Past Surgical History:  Procedure Laterality Date  . ROBOT ASSISTED LAPAROSCOPIC RADICAL PROSTATECTOMY N/A 11/13/2013   Procedure: ROBOTIC ASSISTED LAPAROSCOPIC RADICAL PROSTATECTOMY;  Surgeon: Bernestine Amass, MD;  Location: [facility];  Service: Urology;  Laterality: N/A;  . URETHRAL SLING N/A 10/27/2015   Procedure: CYSTOSCOPY AND MALE SLING;  Surgeon: Bjorn Loser, MD;  Location: [facility];  Service: Urology;  Laterality: N/A;        Home Medications    Prior to Admission medications   Medication Sig Start Date End Date Taking? Authorizing Provider  doxycycline (VIBRAMYCIN) 100 MG capsule Take 1 capsule (100 mg total) by mouth 2 (two) times daily for 10 days. 09/19/18 09/29/18  Gareth Morgan, MD     Family History Family History  Problem Relation Age of Onset  . Hypertension Mother   . Diabetes Mother   . Cancer Mother        breast  . Cancer Father        prostate  . Hypertension Father     Social History Social History   Tobacco Use  . Smoking status: Never Smoker  . Smokeless tobacco: Never Used  Substance Use Topics  . Alcohol use: No  . Drug use: No     Allergies   Patient has no known allergies.   Review of Systems Review of Systems  Constitutional: Negative for fever.  Gastrointestinal: Negative for nausea and vomiting.  Musculoskeletal: Positive for arthralgias.  Skin: Positive for rash.     Physical Exam Updated Vital Signs BP (!) 133/99 (BP Location: Left Arm)   Pulse 72   Temp 98.4 F (36.9 C) (Oral)   Resp 16   SpO2 98%   Physical Exam Vitals signs and nursing note reviewed.  Constitutional:      General: He is not in acute distress.    Appearance: He is well-developed. He is not diaphoretic.  HENT:     Head: Normocephalic and atraumatic.  Eyes:     Conjunctiva/sclera: Conjunctivae normal.  Neck:     Musculoskeletal: Normal range of motion.  Cardiovascular:     Rate and Rhythm: Normal rate and regular rhythm.  Pulmonary:     Effort:  Pulmonary effort is normal. No respiratory distress.     Breath sounds: Normal breath sounds.  Skin:    General: Skin is warm and dry.     Comments: Left great toe with erythema and tenderness medial toe along nail, no fluctuance  Neurological:     Mental Status: He is alert and oriented to person, place, and time.      ED Treatments / Results  Labs (all labs ordered are listed, but only abnormal results are displayed) Labs Reviewed - No data to display  EKG None  Radiology No results found.  Procedures Procedures (including critical care time)  Medications Ordered in ED Medications - No data to display   Initial Impression / Assessment and Plan / ED Course  I have reviewed the  triage vital signs and the nursing notes.  Pertinent labs & imaging results that were available during my care of the patient were reviewed by me and considered in my medical decision making (see chart for details).        48yo male with history of hyperlipidemia, GERD presents with concern for left great toe pain.  Exam consistent with paronychia. No sign of collection for I/D.  Recommend warm soaks, given rx for doxycycline. Recommend tylenol/ibuprofen for pain. Patient discharged in stable condition with understanding of reasons to return.   Final Clinical Impressions(s) / ED Diagnoses   Final diagnoses:  Paronychia of great toe of left foot    ED Discharge Orders         Ordered    doxycycline (VIBRAMYCIN) 100 MG capsule  2 times daily     09/19/18 0747           Gareth Morgan, MD 09/19/18 [phone]

## 2018-09-19 NOTE — ED Triage Notes (Signed)
Pt reports getting pedicure last Friday, Saturday noticed some soreness to his left great toe. Pain has continued, redness, tenderness and swelling noted to outer aspect of great toenail/

## 2018-10-04 DIAGNOSIS — Z719 Counseling, unspecified: Secondary | ICD-10-CM | POA: Diagnosis not present

## 2018-10-30 DIAGNOSIS — M67371 Transient synovitis, right ankle and foot: Secondary | ICD-10-CM | POA: Diagnosis not present

## 2018-10-30 DIAGNOSIS — M659 Synovitis and tenosynovitis, unspecified: Secondary | ICD-10-CM | POA: Diagnosis not present

## 2018-10-30 DIAGNOSIS — M109 Gout, unspecified: Secondary | ICD-10-CM | POA: Diagnosis not present

## 2018-10-30 DIAGNOSIS — M19072 Primary osteoarthritis, left ankle and foot: Secondary | ICD-10-CM | POA: Diagnosis not present

## 2018-11-01 DIAGNOSIS — M109 Gout, unspecified: Secondary | ICD-10-CM | POA: Diagnosis not present

## 2018-11-01 DIAGNOSIS — M7751 Other enthesopathy of right foot: Secondary | ICD-10-CM | POA: Diagnosis not present

## 2018-11-14 DIAGNOSIS — M67371 Transient synovitis, right ankle and foot: Secondary | ICD-10-CM | POA: Diagnosis not present

## 2019-06-03 ENCOUNTER — Other Ambulatory Visit: Payer: Self-pay

## 2019-06-03 ENCOUNTER — Ambulatory Visit (INDEPENDENT_AMBULATORY_CARE_PROVIDER_SITE_OTHER): Payer: 59

## 2019-06-03 ENCOUNTER — Ambulatory Visit (INDEPENDENT_AMBULATORY_CARE_PROVIDER_SITE_OTHER): Payer: 59 | Admitting: Podiatry

## 2019-06-03 DIAGNOSIS — M21619 Bunion of unspecified foot: Secondary | ICD-10-CM

## 2019-06-03 DIAGNOSIS — M21612 Bunion of left foot: Secondary | ICD-10-CM

## 2019-06-03 DIAGNOSIS — M21611 Bunion of right foot: Secondary | ICD-10-CM | POA: Diagnosis not present

## 2019-06-06 NOTE — Progress Notes (Signed)
   Subjective: 48 y.o. male presents today as a new patient, referred by Dr. Gershon Mussel, with a chief complaint of painful bunion deformities of the bilateral feet. Wearing shoes sometimes increases the pain although he denies any symptoms at this time. He has received conservative treatment from Dr. Gershon Mussel and is interested in surgical intervention at this time. Patient is here for further evaluation and treatment.   Past Medical History:  Diagnosis Date  . Cancer [facility])    prostate- surgery only 2015  . GERD (gastroesophageal reflux disease)    OTC meds as needed  . H/O seasonal allergies       Objective: Physical Exam General: The patient is alert and oriented x3 in no acute distress.  Dermatology: Skin is cool, dry and supple bilateral lower extremities. Negative for open lesions or macerations.  Vascular: Palpable pedal pulses bilaterally. No edema or erythema noted. Capillary refill within normal limits.  Neurological: Epicritic and protective threshold grossly intact bilaterally.   Musculoskeletal Exam: Clinical evidence of bunion deformity noted to the respective foot. There is moderate pain on palpation range of motion of the first MPJ. Lateral deviation of the hallux noted consistent with hallux abductovalgus.  Radiographic Exam: Increased intermetatarsal angle greater than 15 with a hallux abductus angle greater than 30 noted on AP view. Moderate degenerative changes noted within the first MPJ.  Assessment: 1. HAV w/ bunion deformity bilateral - currently asymptomatic     Plan of Care:  1. Patient was evaluated. X-Rays reviewed. 2. Recommended conservative treatment at this time since bunions are currently asymptomatic.  3. Continue wearing good shoe gear.  4. Return to clinic as needed when ready for surgery.   Works at [employer].    Edrick Kins, DPM [facility]  Dr. Edrick Kins, [address]                                         [place], [place] [address]                Office [phone]  Fax [phone]

## 2019-12-03 ENCOUNTER — Telehealth: Payer: Self-pay

## 2019-12-03 NOTE — Telephone Encounter (Signed)
[facility] - Client TELEPHONE ADVICE RECORD [facility] Patient Name: Harold Perez Gender: Male DOB: 11-Aug-1970 Age: 49 Y 50 M Return Phone Number: [phone] (Primary) Address: City/State/Zip: [place] [address] Client [place] [facility] - Client Client Site [place] - Day Physician Webb Silversmith - NP Contact Type Call Who Is Calling Patient / Member / Family / Caregiver Call Type Triage / Clinical Relationship To Patient Self Return Phone Number [phone] (Primary) Chief Complaint Hernia Symptoms Reason for Call Symptomatic / Request for [place] states c/o hernia symptoms - feels a pulling sensation, can't turn or walk. Translation No Nurse Assessment Guidelines Guideline Title Affirmed Question Affirmed Notes Nurse Date/Time (Eastern Time) Disp. Time Eilene Ghazi Time) Disposition Final User 12/03/2019 10:38:45 AM Attempt made - message left Neena Rhymes, RNSharyn Lull 12/03/2019 10:53:19 AM Attempt made - no message left Neena Rhymes, RN, Sharyn Lull 12/03/2019 11:20:03 AM FINAL ATTEMPT MADE - message left Yes Neena Rhymes, RN, Sharyn Lull

## 2019-12-03 NOTE — Telephone Encounter (Signed)
Pt having pulling sensation and sharp pain that is consistent (pain level 7-8)on lt side of naval; it hurts worse to lift or turn. Pt said hurts worse when he turns or walks or twist the wrong way. Pt had to leave work. Pt said the pain is constant and has never had anything like this. Pt is going to go to [place] now. FYI to Avie Echevaria NP.

## 2019-12-03 NOTE — Telephone Encounter (Signed)
Agree with advice given

## 2019-12-04 ENCOUNTER — Ambulatory Visit: Payer: 59 | Admitting: Internal Medicine

## 2019-12-04 DIAGNOSIS — Z0289 Encounter for other administrative examinations: Secondary | ICD-10-CM

## 2019-12-04 NOTE — Progress Notes (Deleted)
Subjective:    Patient ID: Harold Perez, male    DOB: July 30, 1970, 49 y.o.   MRN: [id number]  HPI    Review of Systems  Past Medical History:  Diagnosis Date  . Cancer [facility])    prostate- surgery only 2015  . GERD (gastroesophageal reflux disease)    OTC meds as needed  . H/O seasonal allergies     No current outpatient medications on file.   Current Facility-Administered Medications  Medication Dose Route Frequency Provider Last Rate Last Admin  . methylPREDNISolone acetate (DEPO-MEDROL) injection 80 mg  80 mg Intramuscular Once Jearld Fenton, NP        No Known Allergies  Family History  Problem Relation Age of Onset  . Hypertension Mother   . Diabetes Mother   . Cancer Mother        breast  . Cancer Father        prostate  . Hypertension Father     Social History   Socioeconomic History  . Marital status: Married    Spouse name: Not on file  . Number of children: Not on file  . Years of education: Not on file  . Highest education level: Not on file  Occupational History  . Not on file  Tobacco Use  . Smoking status: Never Smoker  . Smokeless tobacco: Never Used  Substance and Sexual Activity  . Alcohol use: No  . Drug use: No  . Sexual activity: Not on file  Other Topics Concern  . Not on file  Social History Narrative  . Not on file   Social Determinants of Health   Financial Resource Strain:   . Difficulty of Paying Living Expenses:   Food Insecurity:   . Worried About Charity fundraiser in the Last Year:   . Arboriculturist in the Last Year:   Transportation Needs:   . Film/video editor (Medical):   Marland Kitchen Lack of Transportation (Non-Medical):   Physical Activity:   . Days of Exercise per Week:   . Minutes of Exercise per Session:   Stress:   . Feeling of Stress :   Social Connections:   . Frequency of Communication with Friends and Family:   . Frequency of Social Gatherings with Friends and Family:   . Attends Religious  Services:   . Active Member of Clubs or Organizations:   . Attends Archivist Meetings:   Marland Kitchen Marital Status:   Intimate Partner Violence:   . Fear of Current or Ex-Partner:   . Emotionally Abused:   Marland Kitchen Physically Abused:   . Sexually Abused:      Constitutional: Denies fever, malaise, fatigue, headache or abrupt weight changes.  HEENT: Denies eye pain, eye redness, ear pain, ringing in the ears, wax buildup, runny nose, nasal congestion, bloody nose, or sore throat. Respiratory: Denies difficulty breathing, shortness of breath, cough or sputum production.   Cardiovascular: Denies chest pain, chest tightness, palpitations or swelling in the hands or feet.  Gastrointestinal: Denies abdominal pain, bloating, constipation, diarrhea or blood in the stool.  GU: Denies urgency, frequency, pain with urination, burning sensation, blood in urine, odor or discharge. Musculoskeletal: Denies decrease in range of motion, difficulty with gait, muscle pain or joint pain and swelling.  Skin: Denies redness, rashes, lesions or ulcercations.  Neurological: Denies dizziness, difficulty with memory, difficulty with speech or problems with balance and coordination.  Psych: Denies anxiety, depression, SI/HI.  No other specific complaints in a  complete review of systems (except as listed in HPI above).     Objective:   Physical Exam    There were no vitals taken for this visit. Wt Readings from Last 3 Encounters:  08/07/18 273 lb (123.8 kg)  07/08/18 250 lb (113.4 kg)  01/12/18 269 lb (122 kg)    General: Appears their stated age, well developed, well nourished in NAD. Skin: Warm, dry and intact. No rashes, lesions or ulcerations noted. HEENT: Head: normal shape and size; Eyes: sclera white, no icterus, conjunctiva pink, PERRLA and EOMs intact; Ears: Tm's gray and intact, normal light reflex; Nose: mucosa pink and moist, septum midline; Throat/Mouth: Teeth present, mucosa pink and moist, no  exudate, lesions or ulcerations noted.  Neck:  Neck supple, trachea midline. No masses, lumps or thyromegaly present.  Cardiovascular: Normal rate and rhythm. S1,S2 noted.  No murmur, rubs or gallops noted. No JVD or BLE edema. No carotid bruits noted. Pulmonary/Chest: Normal effort and positive vesicular breath sounds. No respiratory distress. No wheezes, rales or ronchi noted.  Abdomen: Soft and nontender. Normal bowel sounds. No distention or masses noted. Liver, spleen and kidneys non palpable. Musculoskeletal: Normal range of motion. No signs of joint swelling. No difficulty with gait.  Neurological: Alert and oriented. Cranial nerves II-XII grossly intact. Coordination normal.  Psychiatric: Mood and affect normal. Behavior is normal. Judgment and thought content normal.   EKG:  BMET    Component Value Date/Time   NA 137 07/08/2018 2015   K 4.2 07/08/2018 2015   CL 104 07/08/2018 2015   CO2 26 07/08/2018 2015   GLUCOSE 89 07/08/2018 2015   BUN 17 07/08/2018 2015   CREATININE 1.24 07/08/2018 2015   CALCIUM 9.3 07/08/2018 2015   GFRNONAA >60 07/08/2018 2015   GFRAA >60 07/08/2018 2015    Lipid Panel     Component Value Date/Time   CHOL 214 (H) 07/25/2013 1012   TRIG 107.0 07/25/2013 1012   HDL 41.20 07/25/2013 1012   CHOLHDL 5 07/25/2013 1012   VLDL 21.4 07/25/2013 1012    CBC    Component Value Date/Time   WBC 7.9 07/08/2018 2015   RBC 4.51 07/08/2018 2015   HGB 12.7 (L) 07/08/2018 2015   HCT 39.9 07/08/2018 2015   PLT 138 (L) 07/08/2018 2015   MCV 88.5 07/08/2018 2015   MCH 28.2 07/08/2018 2015   MCHC 31.8 07/08/2018 2015   RDW 14.6 07/08/2018 2015   LYMPHSABS 3.0 07/08/2018 2015   MONOABS 0.7 07/08/2018 2015   EOSABS 0.3 07/08/2018 2015   BASOSABS 0.1 07/08/2018 2015    Hgb A1C Lab Results  Component Value Date   HGBA1C 7.0 (H) 07/25/2013          Assessment & Plan:   Webb Silversmith, NP This visit occurred during the SARS-CoV-2 public health  emergency.  Safety protocols were in place, including screening questions prior to the visit, additional usage of staff PPE, and extensive cleaning of exam room while observing appropriate contact time as indicated for disinfecting solutions.

## 2019-12-05 ENCOUNTER — Encounter (HOSPITAL_BASED_OUTPATIENT_CLINIC_OR_DEPARTMENT_OTHER): Payer: Self-pay | Admitting: Emergency Medicine

## 2019-12-05 ENCOUNTER — Emergency Department (HOSPITAL_BASED_OUTPATIENT_CLINIC_OR_DEPARTMENT_OTHER): Payer: 59

## 2019-12-05 ENCOUNTER — Emergency Department (HOSPITAL_BASED_OUTPATIENT_CLINIC_OR_DEPARTMENT_OTHER)
Admission: EM | Admit: 2019-12-05 | Discharge: 2019-12-05 | Disposition: A | Discharge status: Home / Self Care | Payer: 59 | Attending: Emergency Medicine | Admitting: Emergency Medicine

## 2019-12-05 ENCOUNTER — Other Ambulatory Visit: Payer: Self-pay

## 2019-12-05 DIAGNOSIS — Z8546 Personal history of malignant neoplasm of prostate: Secondary | ICD-10-CM | POA: Insufficient documentation

## 2019-12-05 DIAGNOSIS — E119 Type 2 diabetes mellitus without complications: Secondary | ICD-10-CM | POA: Diagnosis not present

## 2019-12-05 DIAGNOSIS — R1033 Periumbilical pain: Secondary | ICD-10-CM | POA: Diagnosis present

## 2019-12-05 DIAGNOSIS — K439 Ventral hernia without obstruction or gangrene: Secondary | ICD-10-CM | POA: Diagnosis not present

## 2019-12-05 LAB — CBC WITH DIFFERENTIAL/PLATELET
Abs Immature Granulocytes: 0.02 10*3/uL (ref 0.00–0.07)
Basophils Absolute: 0.1 10*3/uL (ref 0.0–0.1)
Basophils Relative: 1 %
Eosinophils Absolute: 0.2 10*3/uL (ref 0.0–0.5)
Eosinophils Relative: 3 %
HCT: 39.4 % (ref 39.0–52.0)
Hemoglobin: 12.9 g/dL — ABNORMAL LOW (ref 13.0–17.0)
Immature Granulocytes: 0 %
Lymphocytes Relative: 30 %
Lymphs Abs: 2.4 10*3/uL (ref 0.7–4.0)
MCH: 28.4 pg (ref 26.0–34.0)
MCHC: 32.7 g/dL (ref 30.0–36.0)
MCV: 86.6 fL (ref 80.0–100.0)
Monocytes Absolute: 0.7 10*3/uL (ref 0.1–1.0)
Monocytes Relative: 9 %
Neutro Abs: 4.5 10*3/uL (ref 1.7–7.7)
Neutrophils Relative %: 57 %
Platelets: 205 10*3/uL (ref 150–400)
RBC: 4.55 MIL/uL (ref 4.22–5.81)
RDW: 14.5 % (ref 11.5–15.5)
WBC: 7.8 10*3/uL (ref 4.0–10.5)
nRBC: 0 % (ref 0.0–0.2)

## 2019-12-05 LAB — COMPREHENSIVE METABOLIC PANEL
ALT: 25 U/L (ref 0–44)
AST: 23 U/L (ref 15–41)
Albumin: 4 g/dL (ref 3.5–5.0)
Alkaline Phosphatase: 94 U/L (ref 38–126)
Anion gap: 9 (ref 5–15)
BUN: 17 mg/dL (ref 6–20)
CO2: 26 mmol/L (ref 22–32)
Calcium: 9.2 mg/dL (ref 8.9–10.3)
Chloride: 101 mmol/L (ref 98–111)
Creatinine, Ser: 1.15 mg/dL (ref 0.61–1.24)
GFR calc Af Amer: >60 mL/min (ref >60)
GFR calc non Af Amer: >60 mL/min (ref >60)
Glucose, Bld: 92 mg/dL (ref 70–99)
Potassium: 4.1 mmol/L (ref 3.5–5.1)
Sodium: 136 mmol/L (ref 135–145)
Total Bilirubin: 0.7 mg/dL (ref 0.3–1.2)
Total Protein: 8.6 g/dL — ABNORMAL HIGH (ref 6.5–8.1)

## 2019-12-05 LAB — LIPASE, BLOOD: Lipase: 26 U/L (ref 11–51)

## 2019-12-05 LAB — LACTIC ACID, PLASMA: Lactic Acid, Venous: 1.2 mmol/L (ref 0.5–1.9)

## 2019-12-05 MED ORDER — IOHEXOL 300 MG/ML  SOLN
100.0000 mL | Freq: Once | INTRAMUSCULAR | Status: AC | PRN
  Administered 2019-12-05: 100 mL via INTRAVENOUS

## 2019-12-05 NOTE — ED Provider Notes (Signed)
[facility] EMERGENCY DEPARTMENT Provider Note   CSN: [id number] Arrival date & time: 12/05/19  0759     History Chief Complaint  Patient presents with  . Abdominal Pain    Draylen Rutkowski is a 49 y.o. male.  The history is provided by the patient and medical records. No language interpreter was used.  Abdominal Pain Pain location:  Periumbilical Pain quality: aching   Pain radiates to:  Does not radiate Pain severity:  Moderate Onset quality:  Gradual Duration:  3 days Timing:  Intermittent Progression:  Waxing and waning Chronicity:  New Relieved by:  Nothing Worsened by:  Palpation Ineffective treatments:  None tried Associated symptoms: no chest pain, no chills, no constipation, no cough, no diarrhea, no dysuria, no fatigue, no fever, no nausea and no shortness of breath        Past Medical History:  Diagnosis Date  . Cancer [facility])    prostate- surgery only 2015  . GERD (gastroesophageal reflux disease)    OTC meds as needed  . H/O seasonal allergies     Patient Active Problem List   Diagnosis Date Noted  . Incontinence of urine 10/27/2015  . Malignant neoplasm of prostate ([place]) 11/13/2013  . DM2 (diabetes mellitus, type 2) ([place]) 08/02/2013  . Hyperlipidemia LDL goal < 100 08/02/2013  . GERD (gastroesophageal reflux disease) 07/25/2013  . Constipation 07/25/2013    Past Surgical History:  Procedure Laterality Date  . ROBOT ASSISTED LAPAROSCOPIC RADICAL PROSTATECTOMY N/A 11/13/2013   Procedure: ROBOTIC ASSISTED LAPAROSCOPIC RADICAL PROSTATECTOMY;  Surgeon: Bernestine Amass, MD;  Location: [facility];  Service: Urology;  Laterality: N/A;  . URETHRAL SLING N/A 10/27/2015   Procedure: CYSTOSCOPY AND MALE SLING;  Surgeon: Bjorn Loser, MD;  Location: [facility];  Service: Urology;  Laterality: N/A;       Family History  Problem Relation Age of Onset  . Hypertension Mother   . Diabetes Mother   . Cancer Mother        breast  . Cancer Father    prostate  . Hypertension Father     Social History   Tobacco Use  . Smoking status: Never Smoker  . Smokeless tobacco: Never Used  Substance Use Topics  . Alcohol use: No  . Drug use: No    Home Medications Prior to Admission medications   Not on File    Allergies    Patient has no known allergies.  Review of Systems   Review of Systems  Constitutional: Negative for chills, diaphoresis, fatigue and fever.  HENT: Negative for congestion and rhinorrhea.   Eyes: Negative for visual disturbance.  Respiratory: Negative for cough, chest tightness and shortness of breath.   Cardiovascular: Negative for chest pain.  Gastrointestinal: Positive for abdominal pain. Negative for abdominal distention, constipation, diarrhea and nausea.  Genitourinary: Negative for dysuria, flank pain and frequency.  Musculoskeletal: Negative for back pain, neck pain and neck stiffness.  Skin: Negative for rash and wound.  Neurological: Negative for light-headedness and headaches.  Psychiatric/Behavioral: Negative for agitation and confusion.    Physical Exam Updated Vital Signs BP (!) 140/93 (BP Location: Right Arm)   Pulse 62   Temp 98.3 F (36.8 C) (Oral)   Resp 16   Ht 6\' 2"  (1.88 m)   Wt 123.9 kg   SpO2 98%   BMI 35.08 kg/m   Physical Exam Vitals and nursing note reviewed.  Constitutional:      General: He is not in acute distress.  Appearance: He is well-developed. He is not ill-appearing, toxic-appearing or diaphoretic.  HENT:     Head: Normocephalic and atraumatic.  Eyes:     Extraocular Movements: Extraocular movements intact.     Conjunctiva/sclera: Conjunctivae normal.  Cardiovascular:     Rate and Rhythm: Normal rate and regular rhythm.     Heart sounds: Normal heart sounds. No murmur.  Pulmonary:     Effort: Pulmonary effort is normal. No respiratory distress.     Breath sounds: Normal breath sounds. No wheezing, rhonchi or rales.  Chest:     Chest wall: No  tenderness.  Abdominal:     General: Abdomen is flat. A surgical scar is present. Bowel sounds are normal. There is no distension.     Palpations: Abdomen is soft.     Tenderness: There is abdominal tenderness in the periumbilical area. There is no right CVA tenderness or left CVA tenderness.     Hernia: A hernia is present. Hernia is present in the umbilical area.    Musculoskeletal:     Cervical back: Neck supple.  Skin:    General: Skin is warm and dry.  Neurological:     General: No focal deficit present.     Mental Status: He is alert.  Psychiatric:        Mood and Affect: Mood normal.     ED Results / Procedures / Treatments   Labs (all labs ordered are listed, but only abnormal results are displayed) Labs Reviewed  CBC WITH DIFFERENTIAL/PLATELET - Abnormal; Notable for the following components:      Result Value   Hemoglobin 12.9 (*)    All other components within normal limits  COMPREHENSIVE METABOLIC PANEL - Abnormal; Notable for the following components:   Total Protein 8.6 (*)    All other components within normal limits  LIPASE, BLOOD  LACTIC ACID, PLASMA  LACTIC ACID, PLASMA    EKG None  Radiology CT ABDOMEN PELVIS W CONTRAST  Result Date: 12/05/2019 CLINICAL DATA:  Abdominal pain for 4 days at a air reducible umbilical hernia EXAM: CT ABDOMEN AND PELVIS WITH CONTRAST TECHNIQUE: Multidetector CT imaging of the abdomen and pelvis was performed using the standard protocol following bolus administration of intravenous contrast. CONTRAST:  178mL OMNIPAQUE IOHEXOL 300 MG/ML  SOLN COMPARISON:  07/08/2018 FINDINGS: Lower chest:  No contributory findings. Hepatobiliary: No focal liver abnormality.No evidence of biliary obstruction or stone. Pancreas: Unremarkable. Spleen: Unremarkable. Adrenals/Urinary Tract: Negative adrenals. No hydronephrosis or stone. Peripherally calcified cystic density anterior to the bladder measuring 4.4 cm, intimately associated with the wall  but not clearly a diverticulum. There is been no change from prior. No communication with the umbilicus to implicate a urachal remnant. Patient has had continence surgery in the past and this may be related to prior surgery or device explant Stomach/Bowel:  No obstruction. No appendicitis. Vascular/Lymphatic: No acute vascular abnormality. No mass or adenopathy. Reproductive:Prostatectomy. Other: Fatty umbilical and supraumbilical hernias with mild fat reticulation at the upper hernia that is similar to prior. Musculoskeletal: No acute abnormalities. IMPRESSION: 1. Umbilical and supraumbilical fatty hernias that are similar to January 2020 CT. 2. Chronic, 4.4 cm peripherally calcified cystic structure ventral to the bladder which may be related to history of prior genitourinary surgery. This could be addressed at urologic follow-up. Electronically Signed   By: Monte Fantasia M.D.   On: 12/05/2019 10:46    Procedures Procedures (including critical care time)  Medications Ordered in ED Medications  iohexol (OMNIPAQUE) 300 MG/ML solution  100 mL (100 mLs Intravenous Contrast Given 12/05/19 0957)    ED Course  I have reviewed the triage vital signs and the nursing notes.  Pertinent labs & imaging results that were available during my care of the patient were reviewed by me and considered in my medical decision making (see chart for details).    MDM Rules/Calculators/A&P                      Creede Coniglio is a 49 y.o. male with a past medical history significant for diabetes, hyperlipidemia, prior prostate cancer status post resection, GERD, who presents with abdominal pain.  Patient reports that several years ago he had abdominal surgery from his prostate cancer and over the last 3 years has had a hernia near the surgical site in his periumbilical area.  He reports that it is giving him pain on and off over the years but has never had persistent pain.  He reports he has never been able to reduce the  hernia.  He says that over the last 3 days, the pain has worsened and is now persistent.  He reports that his worst it was up to a 10 out of 10 in severity but is currently a 7 out of 10.  He reports no constipation, diarrhea, nausea, or vomiting but reports the pain is persistent and he is very tender in the periumbilical area.  He denies fevers, chills, Covid symptoms.  No syncopal episodes.  No hematemesis or melena.  No other complaints today.  No recent trauma.  He does report that the pain started after he was lifting heavy things at work.  On exam, patient does have severe tenderness on the abdomen near the.  Will like [place].  There is some fullness but hard to palpate the edges of the hernia.  Nonreducible at this time.  Bowel sounds were present.  Chest back nontender.  Patient denies any groin symptoms.  Clinically I suspect patient is having some pain related to his hernia however, as he had persistent pain for last 3 days, cannot rule out incarceration.  Patient will have some screening labs and imaging.  If there is no evidence of incarceration, dissipate general surgery follow-up for further management.  As he reports his hernia has never been reduced in several years, I have a low suspicion we will be able to reduce it at this time.  Anticipate reassessment after imaging.  He drove today so we will hold on pain medicine initially.  CT scan showed fatty hernia with no evidence of incarceration or abscess.  No evidence of bowel involvement.  Previous urologic abnormalities are seen and discussed with patient.  He will follow-up for this.  He will follow-up with general surgery for the hernia but do not feel he needs any emergent evaluation or transfer.  Patient agrees with plan of care and will manage it at home with over-the-counter medications.  He was encouraged to use a hernia belt for support and will follow up with general surgery.  He had no other questions or concerns and was discharged in  good condition.   Final Clinical Impression(s) / ED Diagnoses Final diagnoses:  Periumbilical abdominal pain  Hernia of abdominal wall    Rx / DC Orders ED Discharge Orders    None      Clinical Impression: 1. Periumbilical abdominal pain   2. Hernia of abdominal wall     Disposition: Discharge  Condition: Good  I have discussed  the results, Dx and Tx plan with the pt(& family if present). He/she/they expressed understanding and agree(s) with the plan. Discharge instructions discussed at great length. Strict return precautions discussed and pt &/or family have verbalized understanding of the instructions. No further questions at time of discharge.    New Prescriptions   No medications on file    Follow Up: Surgery, [facility] [address] [place] [address] [phone]     Baity, [place], NP [place] [address] [place] [address] [id number] [place] [facility] [phone]       Chai Routh, Gwenyth Allegra, MD 12/05/19 1231

## 2019-12-05 NOTE — ED Notes (Signed)
ED Provider at bedside discussing dispo plan.

## 2019-12-05 NOTE — Discharge Instructions (Signed)
Your history, exam, and imaging are consistent with a periumbilical hernia that is causing your discomfort.  There was no evidence of bowel in the hernia and there was no palpable area to reduce.  As there was no incarceration, we feel you are safe for discharge home for outpatient general surgery follow-up to discuss further management.  Please use ice, over-the-counter pain medication, and use a hernia belt for support and rest.  If any symptoms change or worsen or you get or you are having uncontrolled pain, nausea, vomiting, decreased bowel movements or passing gas, or it starts to look different, please return to the nearest emergency department immediately.

## 2019-12-05 NOTE — ED Triage Notes (Signed)
Pt has had periumbilical hernia for a couple years.  He was told if it didn't both him not to worry about it.  This week it has started bothering him.  Severe pain 3 days ago.  Has eased off some now but sts, "I'm ready to get this looked at."

## 2019-12-09 ENCOUNTER — Ambulatory Visit: Payer: 59 | Admitting: Internal Medicine

## 2019-12-09 DIAGNOSIS — Z0289 Encounter for other administrative examinations: Secondary | ICD-10-CM

## 2019-12-09 NOTE — Progress Notes (Deleted)
Subjective:    Patient ID: Harold Perez, male    DOB: [birth year], 49 y.o.   MRN: [id number]  HPI  Patient presents the clinic today for ER follow-up.  He went to the ER 6/3 with complaint of umbilical mass and abdominal pain.  CT scan of the abdomen showed umbilical and supraumbilical hernias, which were not incarcerated.  Labs were unremarkable.  He was advised to follow-up with general surgery as an outpatient.  Since discharge.  Review of Systems      Past Medical History:  Diagnosis Date  . Cancer [facility])    prostate- surgery only 2015  . GERD (gastroesophageal reflux disease)    OTC meds as needed  . H/O seasonal allergies     No current outpatient medications on file.   Current Facility-Administered Medications  Medication Dose Route Frequency Provider Last Rate Last Admin  . methylPREDNISolone acetate (DEPO-MEDROL) injection 80 mg  80 mg Intramuscular Once Jearld Fenton, NP        No Known Allergies  Family History  Problem Relation Age of Onset  . Hypertension Mother   . Diabetes Mother   . Cancer Mother        breast  . Cancer Father        prostate  . Hypertension Father     Social History   Socioeconomic History  . Marital status: Married    Spouse name: Not on file  . Number of children: Not on file  . Years of education: Not on file  . Highest education level: Not on file  Occupational History  . Not on file  Tobacco Use  . Smoking status: Never Smoker  . Smokeless tobacco: Never Used  Substance and Sexual Activity  . Alcohol use: No  . Drug use: No  . Sexual activity: Not on file  Other Topics Concern  . Not on file  Social History Narrative  . Not on file   Social Determinants of Health   Financial Resource Strain:   . Difficulty of Paying Living Expenses:   Food Insecurity:   . Worried About Charity fundraiser in the Last Year:   . Arboriculturist in the Last Year:   Transportation Needs:   . Film/video editor  (Medical):   Marland Kitchen Lack of Transportation (Non-Medical):   Physical Activity:   . Days of Exercise per Week:   . Minutes of Exercise per Session:   Stress:   . Feeling of Stress :   Social Connections:   . Frequency of Communication with Friends and Family:   . Frequency of Social Gatherings with Friends and Family:   . Attends Religious Services:   . Active Member of Clubs or Organizations:   . Attends Archivist Meetings:   Marland Kitchen Marital Status:   Intimate Partner Violence:   . Fear of Current or Ex-Partner:   . Emotionally Abused:   Marland Kitchen Physically Abused:   . Sexually Abused:      Constitutional: Denies fever, malaise, fatigue, headache or abrupt weight changes.  HEENT: Denies eye pain, eye redness, ear pain, ringing in the ears, wax buildup, runny nose, nasal congestion, bloody nose, or sore throat. Respiratory: Denies difficulty breathing, shortness of breath, cough or sputum production.   Cardiovascular: Denies chest pain, chest tightness, palpitations or swelling in the hands or feet.  Gastrointestinal: Patient reports umbilical hernia.  Denies abdominal pain, bloating, constipation, diarrhea or blood in the stool.  GU: Denies urgency, frequency,  pain with urination, burning sensation, blood in urine, odor or discharge. Musculoskeletal: Denies decrease in range of motion, difficulty with gait, muscle pain or joint pain and swelling.  Skin: Denies redness, rashes, lesions or ulcercations.  Neurological: Denies dizziness, difficulty with memory, difficulty with speech or problems with balance and coordination.  Psych: Denies anxiety, depression, SI/HI.  No other specific complaints in a complete review of systems (except as listed in HPI above).  Objective:   Physical Exam  There were no vitals taken for this visit. Wt Readings from Last 3 Encounters:  12/05/19 273 lb 3.2 oz (123.9 kg)  08/07/18 273 lb (123.8 kg)  07/08/18 250 lb (113.4 kg)    General: Appears their  stated age, well developed, well nourished in NAD. Skin: Warm, dry and intact. No rashes, lesions or ulcerations noted. HEENT: Head: normal shape and size; Eyes: sclera white, no icterus, conjunctiva pink, PERRLA and EOMs intact; Ears: Tm's gray and intact, normal light reflex; Nose: mucosa pink and moist, septum midline; Throat/Mouth: Teeth present, mucosa pink and moist, no exudate, lesions or ulcerations noted.  Neck:  Neck supple, trachea midline. No masses, lumps or thyromegaly present.  Cardiovascular: Normal rate and rhythm. S1,S2 noted.  No murmur, rubs or gallops noted. No JVD or BLE edema. No carotid bruits noted. Pulmonary/Chest: Normal effort and positive vesicular breath sounds. No respiratory distress. No wheezes, rales or ronchi noted.  Abdomen: Soft and nontender. Normal bowel sounds. No distention or masses noted. Liver, spleen and kidneys non palpable. Musculoskeletal: Normal range of motion. No signs of joint swelling. No difficulty with gait.  Neurological: Alert and oriented. Cranial nerves II-XII grossly intact. Coordination normal.  Psychiatric: Mood and affect normal. Behavior is normal. Judgment and thought content normal.     BMET    Component Value Date/Time   NA 136 12/05/2019 0950   K 4.1 12/05/2019 0950   CL 101 12/05/2019 0950   CO2 26 12/05/2019 0950   GLUCOSE 92 12/05/2019 0950   BUN 17 12/05/2019 0950   CREATININE 1.15 12/05/2019 0950   CALCIUM 9.2 12/05/2019 0950   GFRNONAA >60 12/05/2019 0950   GFRAA >60 12/05/2019 0950    Lipid Panel     Component Value Date/Time   CHOL 214 (H) 07/25/2013 1012   TRIG 107.0 07/25/2013 1012   HDL 41.20 07/25/2013 1012   CHOLHDL 5 07/25/2013 1012   VLDL 21.4 07/25/2013 1012    CBC    Component Value Date/Time   WBC 7.8 12/05/2019 0950   RBC 4.55 12/05/2019 0950   HGB 12.9 (L) 12/05/2019 0950   HCT 39.4 12/05/2019 0950   PLT 205 12/05/2019 0950   MCV 86.6 12/05/2019 0950   MCH 28.4 12/05/2019 0950    MCHC 32.7 12/05/2019 0950   RDW 14.5 12/05/2019 0950   LYMPHSABS 2.4 12/05/2019 0950   MONOABS 0.7 12/05/2019 0950   EOSABS 0.2 12/05/2019 0950   BASOSABS 0.1 12/05/2019 0950    Hgb A1C Lab Results  Component Value Date   HGBA1C 7.0 (H) 07/25/2013          Assessment & Plan:  ER follow-up for Periumbilical Abdominal Pain, Umbilical Hernia:  ER note labs and imaging reviewed Referral to general surgery placed for further evaluation and treatment  RTC as needed or if symptoms persist or worsen. Webb Silversmith, NP This visit occurred during the SARS-CoV-2 public health emergency.  Safety protocols were in place, including screening questions prior to the visit, additional usage of staff PPE, and  extensive cleaning of exam room while observing appropriate contact time as indicated for disinfecting solutions.

## 2020-01-01 ENCOUNTER — Ambulatory Visit: Payer: Self-pay | Admitting: General Surgery

## 2020-02-18 ENCOUNTER — Encounter: Payer: Self-pay | Admitting: Internal Medicine

## 2020-02-18 ENCOUNTER — Ambulatory Visit (INDEPENDENT_AMBULATORY_CARE_PROVIDER_SITE_OTHER): Payer: 59 | Admitting: Internal Medicine

## 2020-02-18 ENCOUNTER — Other Ambulatory Visit: Payer: Self-pay

## 2020-02-18 ENCOUNTER — Telehealth: Payer: Self-pay | Admitting: Internal Medicine

## 2020-02-18 VITALS — BP 126/84 | HR 75 | Temp 97.7°F | Ht 73.5 in | Wt 263.2 lb

## 2020-02-18 DIAGNOSIS — K429 Umbilical hernia without obstruction or gangrene: Secondary | ICD-10-CM

## 2020-02-18 NOTE — Telephone Encounter (Signed)
fmla paperwork in [facility]  In box

## 2020-02-18 NOTE — Telephone Encounter (Signed)
Patient came into office and would like the out of work note printed ASAP. Please advise.

## 2020-02-18 NOTE — Telephone Encounter (Signed)
Done, given back to Robin 

## 2020-02-18 NOTE — Progress Notes (Signed)
Subjective:    Patient ID: Harold Perez, male    DOB: June 09, 1971, 49 y.o.   MRN: [id number]  HPI  Pt presents to the clinic today for ER follow up. He went to the ER 6/3 with c/o abdominal pain. CT abd/pelvis showed a small fat-containing non incarcerated hernia.  He was given an abdominal binder and advised to follow-up with general surgery.  He reports he has an appointment for surgery on September 17 with Dr. Alvino Blood.  He was advised to contact me for FMLA papers to put him out of work from today until September 16 due to worsening peri-umbilical hernia pain.  He reports his job involves a lot of heavy lifting and there are no jobs available where he works that does not include lifting.  Review of Systems      Past Medical History:  Diagnosis Date  . Cancer [facility])    prostate- surgery only 2015  . GERD (gastroesophageal reflux disease)    OTC meds as needed  . H/O seasonal allergies     No current outpatient medications on file.   Current Facility-Administered Medications  Medication Dose Route Frequency Provider Last Rate Last Admin  . methylPREDNISolone acetate (DEPO-MEDROL) injection 80 mg  80 mg Intramuscular Once Jearld Fenton, NP        No Known Allergies  Family History  Problem Relation Age of Onset  . Hypertension Mother   . Diabetes Mother   . Cancer Mother        breast  . Cancer Father        prostate  . Hypertension Father     Social History   Socioeconomic History  . Marital status: Married    Spouse name: Not on file  . Number of children: Not on file  . Years of education: Not on file  . Highest education level: Not on file  Occupational History  . Not on file  Tobacco Use  . Smoking status: Never Smoker  . Smokeless tobacco: Never Used  Substance and Sexual Activity  . Alcohol use: No  . Drug use: No  . Sexual activity: Not on file  Other Topics Concern  . Not on file  Social History Narrative  . Not on file   Social  Determinants of Health   Financial Resource Strain:   . Difficulty of Paying Living Expenses:   Food Insecurity:   . Worried About Charity fundraiser in the Last Year:   . Arboriculturist in the Last Year:   Transportation Needs:   . Film/video editor (Medical):   Marland Kitchen Lack of Transportation (Non-Medical):   Physical Activity:   . Days of Exercise per Week:   . Minutes of Exercise per Session:   Stress:   . Feeling of Stress :   Social Connections:   . Frequency of Communication with Friends and Family:   . Frequency of Social Gatherings with Friends and Family:   . Attends Religious Services:   . Active Member of Clubs or Organizations:   . Attends Archivist Meetings:   Marland Kitchen Marital Status:   Intimate Partner Violence:   . Fear of Current or Ex-Partner:   . Emotionally Abused:   Marland Kitchen Physically Abused:   . Sexually Abused:      Constitutional: Denies fever, malaise, fatigue, headache or abrupt weight changes.  Respiratory: Denies difficulty breathing, shortness of breath, cough or sputum production.   Cardiovascular: Denies chest pain, chest tightness, palpitations  or swelling in the hands or feet.  Gastrointestinal: Pt reports abdominal hernia. Denies bloating, constipation, diarrhea or blood in the stool.  GU: Denies urgency, frequency, pain with urination, burning sensation, blood in urine, odor or discharge.  No other specific complaints in a complete review of systems (except as listed in HPI above).  Objective:   Physical Exam   BP 126/84 (BP Location: Left Arm, Patient Position: Sitting, Cuff Size: Large)   Pulse 75   Temp 97.7 F (36.5 C) (Temporal)   Ht 6' 1.5" (1.867 m)   Wt 263 lb 4 oz (119.4 kg)   SpO2 95%   BMI 34.26 kg/m  Wt Readings from Last 3 Encounters:  02/18/20 263 lb 4 oz (119.4 kg)  12/05/19 273 lb 3.2 oz (123.9 kg)  08/07/18 273 lb (123.8 kg)    General: Appears his stated age, obese, in NAD. Cardiovascular: Normal rate and  rhythm. S1,S2 noted.  No murmur, rubs or gallops noted.  Pulmonary/Chest: Normal effort and positive vesicular breath sounds. No respiratory distress. No wheezes, rales or ronchi noted.  Abdomen: Soft. Hernia noted just above the umbilicus, easily reduced. Mildly tender with palpation. Neurological: Alert and oriented.   BMET    Component Value Date/Time   NA 136 12/05/2019 0950   K 4.1 12/05/2019 0950   CL 101 12/05/2019 0950   CO2 26 12/05/2019 0950   GLUCOSE 92 12/05/2019 0950   BUN 17 12/05/2019 0950   CREATININE 1.15 12/05/2019 0950   CALCIUM 9.2 12/05/2019 0950   GFRNONAA >60 12/05/2019 0950   GFRAA >60 12/05/2019 0950    Lipid Panel     Component Value Date/Time   CHOL 214 (H) 07/25/2013 1012   TRIG 107.0 07/25/2013 1012   HDL 41.20 07/25/2013 1012   CHOLHDL 5 07/25/2013 1012   VLDL 21.4 07/25/2013 1012    CBC    Component Value Date/Time   WBC 7.8 12/05/2019 0950   RBC 4.55 12/05/2019 0950   HGB 12.9 (L) 12/05/2019 0950   HCT 39.4 12/05/2019 0950   PLT 205 12/05/2019 0950   MCV 86.6 12/05/2019 0950   MCH 28.4 12/05/2019 0950   MCHC 32.7 12/05/2019 0950   RDW 14.5 12/05/2019 0950   LYMPHSABS 2.4 12/05/2019 0950   MONOABS 0.7 12/05/2019 0950   EOSABS 0.2 12/05/2019 0950   BASOSABS 0.1 12/05/2019 0950    Hgb A1C Lab Results  Component Value Date   HGBA1C 7.0 (H) 07/25/2013           Assessment & Plan:   ER Follow Up for Periumbilical Hernia:  ER notes, labs and imaging reviewed Encouraged him to wear his abdominal binder Will fill out FMLA forms- he will need to drop these off. Will approve continuous leave 8/17-9/16. He will continue to follow up with surgery as planned Advised him at this appt, the importance of showing up to appts and being on time. Pt understands.  Return precautions discussed Webb Silversmith, NP This visit occurred during the SARS-CoV-2 public health emergency.  Safety protocols were in place, including screening questions  prior to the visit, additional usage of staff PPE, and extensive cleaning of exam room while observing appropriate contact time as indicated for disinfecting solutions.

## 2020-02-18 NOTE — Telephone Encounter (Signed)
Paperwork faxed Pt has his copy

## 2020-02-18 NOTE — Patient Instructions (Signed)
Hernia, Adult     A hernia happens when tissue inside your body pushes out through a weak spot in your belly muscles (abdominal wall). This makes a round lump (bulge). The lump may be:  In a scar from surgery that was done in your belly (incisional hernia).  Near your belly button (umbilical hernia).  In your groin (inguinal hernia). Your groin is the area where your leg meets your lower belly (abdomen). This kind of hernia could also be: ? In your scrotum, if you are male. ? In folds of skin around your vagina, if you are male.  In your upper thigh (femoral hernia).  Inside your belly (hiatal hernia). This happens when your stomach slides above the muscle between your belly and your chest (diaphragm). If your hernia is small and it does not cause pain, you may not need treatment. If your hernia is large or it causes pain, you may need surgery. Follow these instructions at home: Activity  Avoid stretching or overusing (straining) the muscles near your hernia. Straining can happen when you: ? Lift something heavy. ? Poop (have a bowel movement).  Do not lift anything that is heavier than 10 lb (4.5 kg), or the limit that you are told, until your doctor says that it is safe.  Use the strength of your legs when you lift something heavy. Do not use only your back muscles to lift. General instructions  Do these things if told by your doctor so you do not have trouble pooping (constipation): ? Drink enough fluid to keep your pee (urine) pale yellow. ? Eat foods that are high in fiber. These include fresh fruits and vegetables, whole grains, and beans. ? Limit foods that are high in fat and processed sugars. These include foods that are fried or sweet. ? Take medicine for trouble pooping.  When you cough, try to cough gently.  You may try to push your hernia in by very gently pressing on it when you are lying down. Do not try to force the bulge back in if it will not push in  easily.  If you are overweight, work with your doctor to lose weight safely.  Do not use any products that have nicotine or tobacco in them. These include cigarettes and e-cigarettes. If you need help quitting, ask your doctor.  If you will be having surgery (hernia repair), watch your hernia for changes in shape, size, or color. Tell your doctor if you see any changes.  Take over-the-counter and prescription medicines only as told by your doctor.  Keep all follow-up visits as told by your doctor. Contact a doctor if:  You get new pain, swelling, or redness near your hernia.  You poop fewer times in a week than normal.  You have trouble pooping.  You have poop (stool) that is more dry than normal.  You have poop that is harder or larger than normal. Get help right away if:  You have a fever.  You have belly pain that gets worse.  You feel sick to your stomach (nauseous).  You throw up (vomit).  Your hernia cannot be pushed in by very gently pressing on it when you are lying down. Do not try to force the bulge back in if it will not push in easily.  Your hernia: ? Changes in shape or size. ? Changes color. ? Feels hard or it hurts when you touch it. These symptoms may represent a serious problem that is an emergency. Do not   wait to see if the symptoms will go away. Get medical help right away. Call your local emergency services (911 in the [place] Summary  A hernia happens when tissue inside your body pushes out through a weak spot in the belly muscles. This creates a bulge.  If your hernia is small and it does not hurt, you may not need treatment. If your hernia is large or it hurts, you may need surgery.  If you will be having surgery, watch your hernia for changes in shape, size, or color. Tell your doctor about any changes. This information is not intended to replace advice given to you by your health care provider. Make sure you discuss any questions you have with  your health care provider. Document Revised: 10/11/2018 Document Reviewed: 03/22/2017 Elsevier Patient Education  2020 Elsevier Inc.  

## 2020-02-18 NOTE — Telephone Encounter (Signed)
Pt states that he is aware of FMLA PPW but he was told by his job that they would also like to have a note stating this as well to have on file.

## 2020-02-18 NOTE — Telephone Encounter (Signed)
Placed in the front office for pt to pick up

## 2020-02-18 NOTE — Telephone Encounter (Signed)
Its not an out of work note. It is FMLA forms. He has to drop them off.

## 2020-02-18 NOTE — Telephone Encounter (Signed)
[place] for work note to excuse him from 8/17-9/16/21

## 2020-02-27 NOTE — Telephone Encounter (Signed)
Copy for scan 

## 2020-04-13 ENCOUNTER — Encounter: Payer: 59 | Admitting: Internal Medicine

## 2020-06-02 ENCOUNTER — Encounter: Payer: 59 | Admitting: Internal Medicine

## 2020-06-29 IMAGING — CT CT ABD-PELV W/ CM
2 of 5 series · 16 of 46 positions shown, 18 images · IV contrast (ISOVUE)
Comparison: None

CLINICAL DATA: Upper abdominal pain, nausea, question hernia,
history GERD, prostate cancer

EXAM:
CT ABDOMEN AND PELVIS WITH CONTRAST
TECHNIQUE: Multidetector CT imaging of the abdomen and pelvis was performed
using the standard protocol following bolus administration of
intravenous contrast. Sagittal and coronal MPR images reconstructed
from axial data set.
CONTRAST:  100mL 3EPIXB-DGG IOPAMIDOL (3EPIXB-DGG) INJECTION 61% IV.
No oral contrast.

[Series 2: axial st · axial · 0.98mm/px · z∈[+1031,+1451]mm · 13 of 97 slices shown, 15 images]
[im 7/97  soft-tissue]
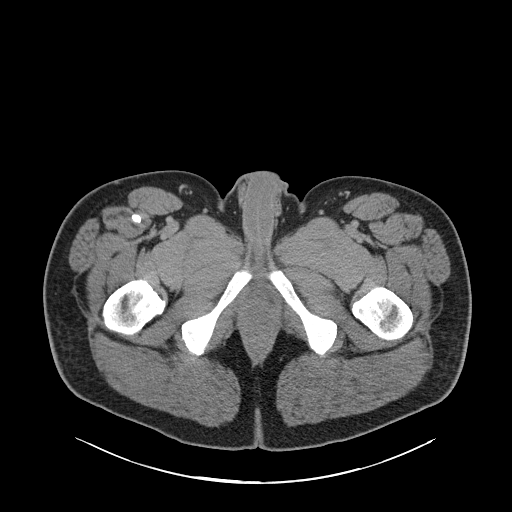
[im 7/97  bone]
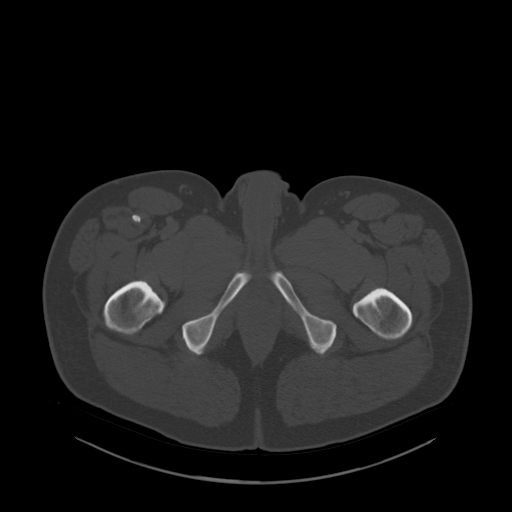
[im 13/97  soft-tissue]
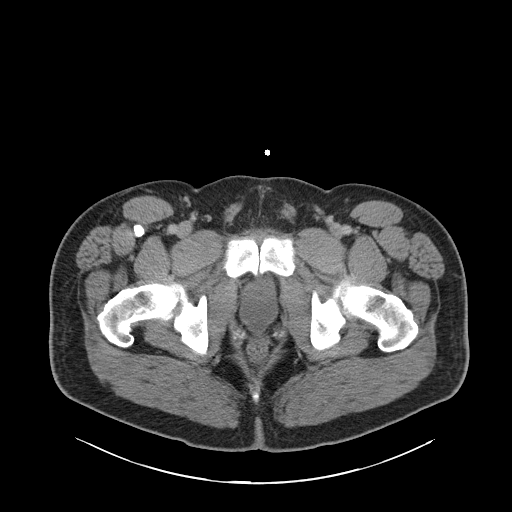
[im 19/97  soft-tissue]
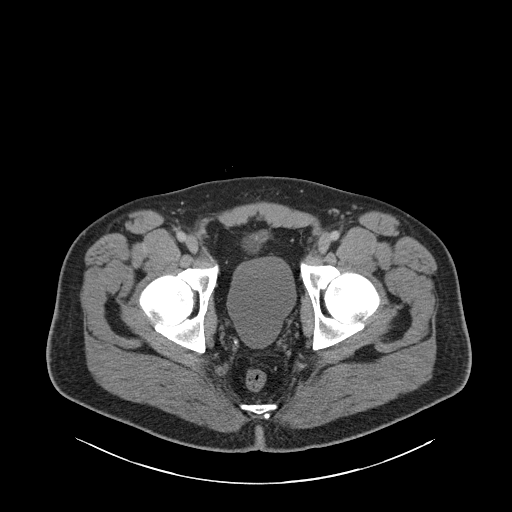
[im 31/97  soft-tissue]
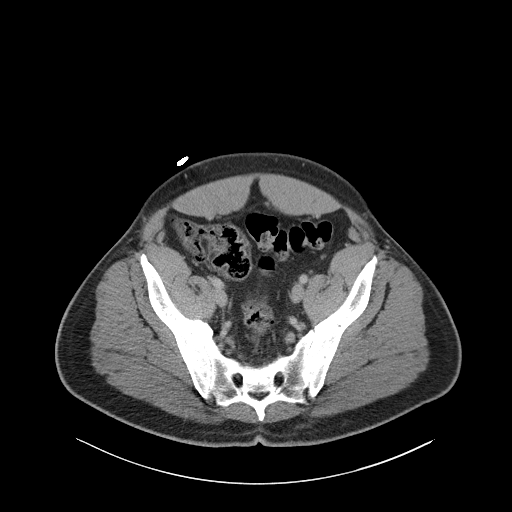
[im 37/97  soft-tissue]
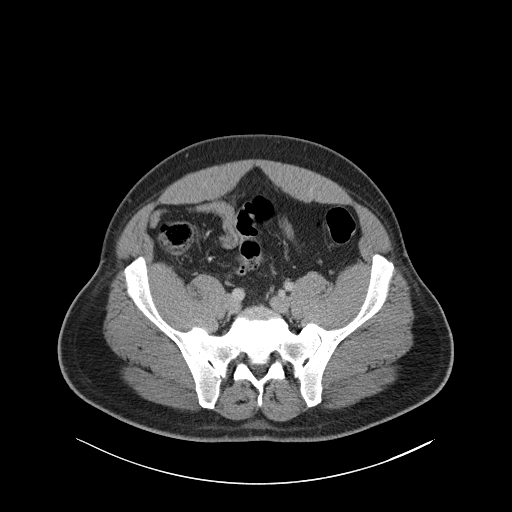
[im 43/97  soft-tissue]
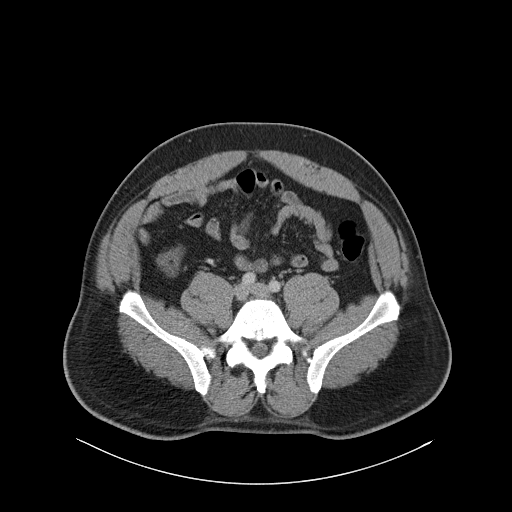
[im 49/97  soft-tissue]
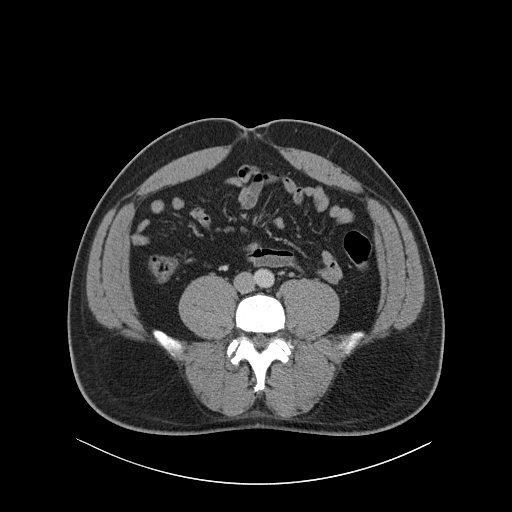
[im 55/97  soft-tissue]
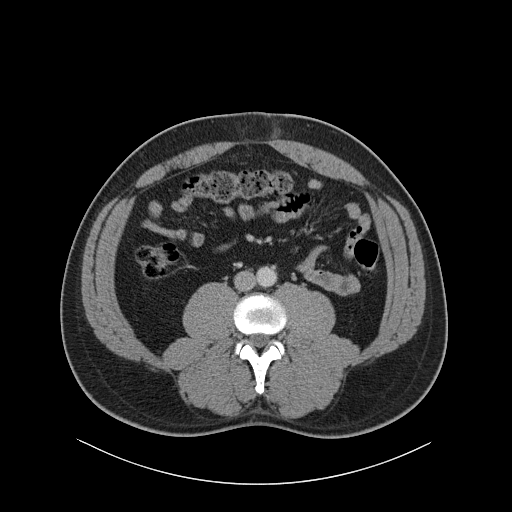
[im 61/97  soft-tissue]
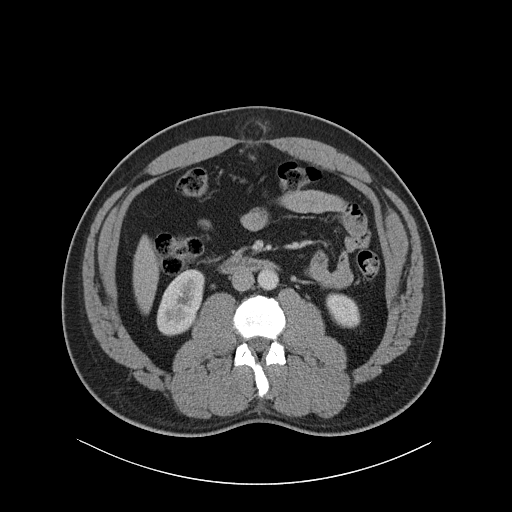
[im 61/97  bone]
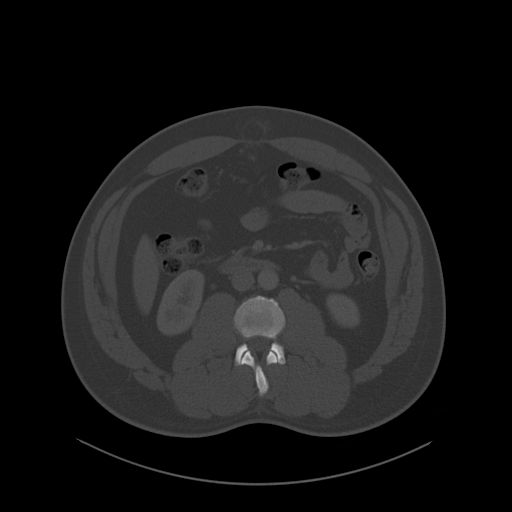
[im 67/97  soft-tissue]
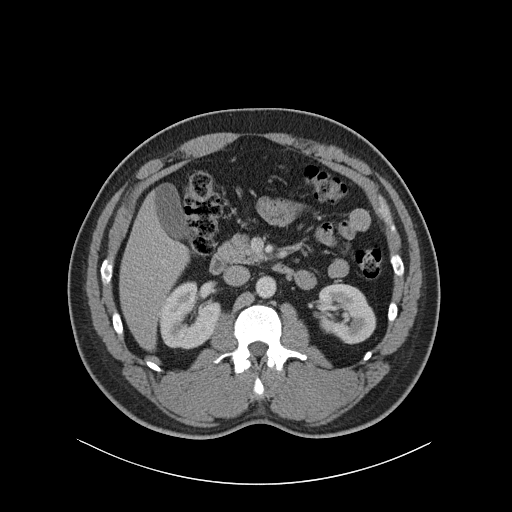
[im 79/97  soft-tissue]
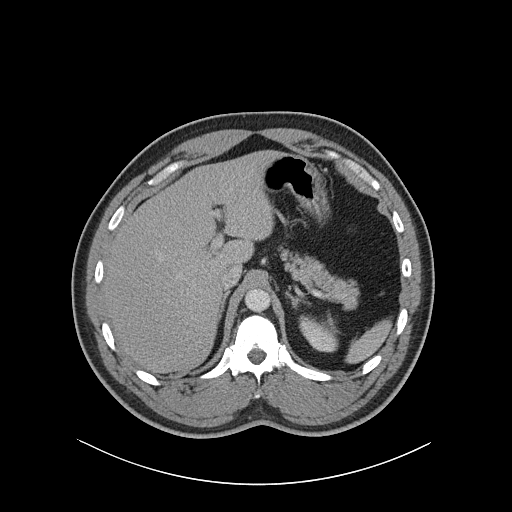
[im 85/97  soft-tissue]
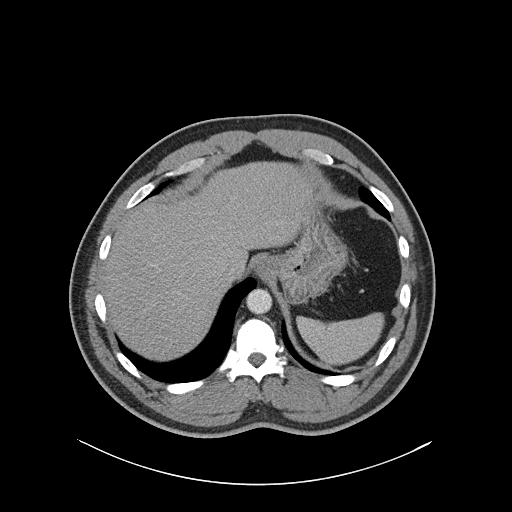
[im 91/97  soft-tissue]
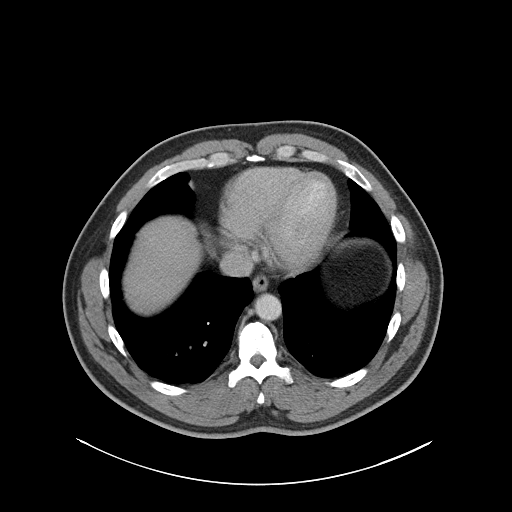

[Series 4: coronal st · coronal · 0.92mm/px · 3 of 115 slices shown]
[im 39/115  soft-tissue]
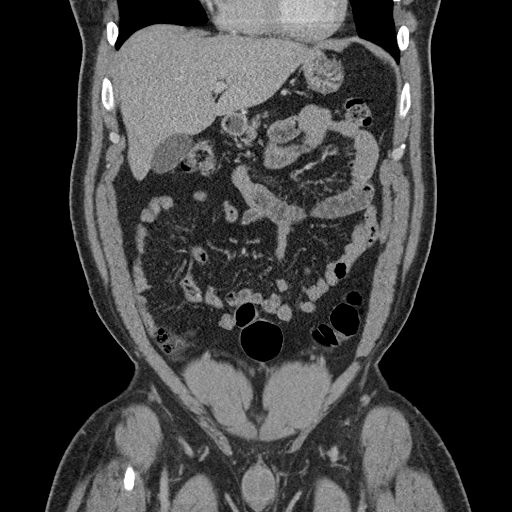
[im 51/115  soft-tissue]
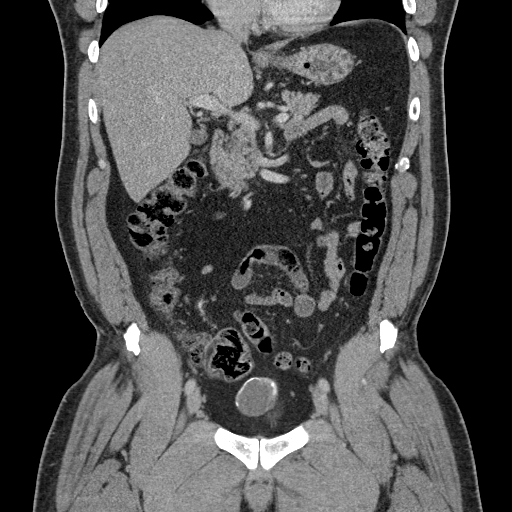
[im 64/115  soft-tissue]
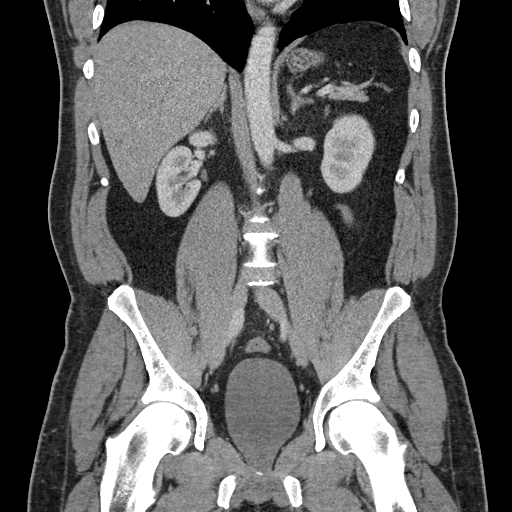

[16 of 46 positions shown; findings below may reference images not displayed]

FINDINGS: Lower chest: Lung bases clear

Hepatobiliary: Gallbladder and liver normal appearance

Pancreas: Normal appearance

Spleen: Normal appearance

Adrenals/Urinary Tract: Adrenal glands, kidneys, and ureters normal
appearance. Cystic structure with a partially calcified wall
identified anterior to the upper to mid urinary bladder, 3.3 x 4.1 x
3.9 cm in size, slightly indenting the bladder wall. This appears
extrinsic to the urinary bladder. Potentially this could represent a
calcified urachal cyst. Bladder diverticulum and contain bladder
perforation with wall calcification are considered less likely
etiologies. Remainder of bladder normal appearance.

Stomach/Bowel: Normal appendix. Stomach and bowel loops normal
appearance.

Vascular/Lymphatic: Vascular structures patent.  No adenopathy.

Reproductive: Post prostatectomy. Nonvisualization of seminal
vesicles.

Other: Tiny umbilical hernia containing fat. Small supraumbilical
ventral hernia at midline containing fat, fascial defect 13 x 16 mm.
No inguinal hernias. No free intraperitoneal air or fluid.

Musculoskeletal: Unremarkable
IMPRESSION: Small supraumbilical ventral hernia at midline containing fat, with
fascial defect 13 x 16 mm.

Tiny umbilical hernia containing fat.

Cystic structure with calcified wall anterior to the upper to mid
urinary bladder measuring 3.3 x 4.1 x 3.9 cm in size, question
calcified urachal cyst, less likely bladder diverticulum or sequela
of contained bladder perforation; recommend follow-up ultrasound
assessment.

## 2021-11-26 IMAGING — CT CT ABD-PELV W/ CM
2 of 5 series · 16 of 46 positions shown, 18 images · IV contrast (Omnipaque)
Comparison: 07/08/2018

CLINICAL DATA: Abdominal pain for 4 days at a air reducible
umbilical hernia

EXAM:
CT ABDOMEN AND PELVIS WITH CONTRAST
TECHNIQUE: Multidetector CT imaging of the abdomen and pelvis was performed
using the standard protocol following bolus administration of
intravenous contrast.
CONTRAST:  100mL OMNIPAQUE IOHEXOL 300 MG/ML  SOLN

[Series 2: axial st · axial · 0.86mm/px · z∈[-618,-148]mm · 13 of 106 slices shown, 15 images]
[im 6/106  soft-tissue]
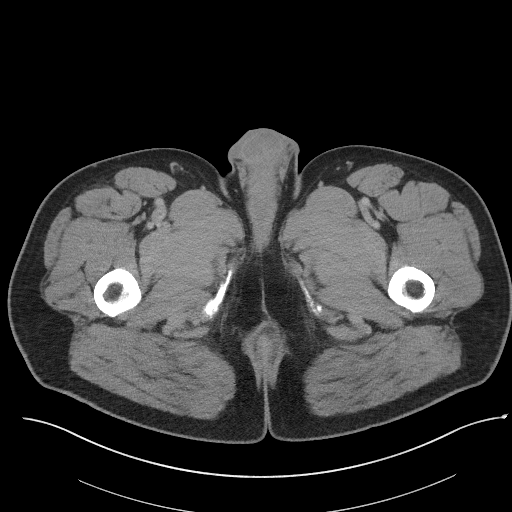
[im 6/106  bone]
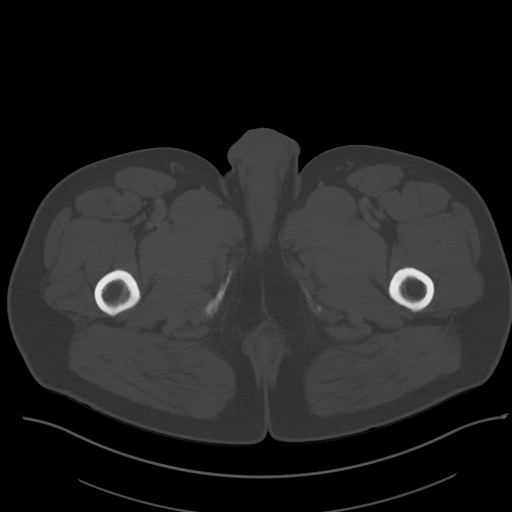
[im 17/106  soft-tissue]
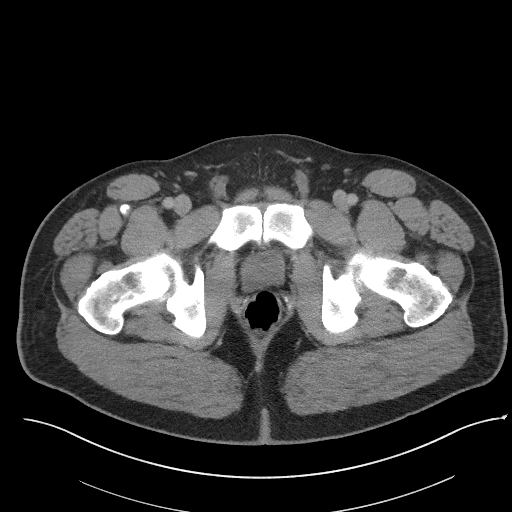
[im 23/106  soft-tissue]
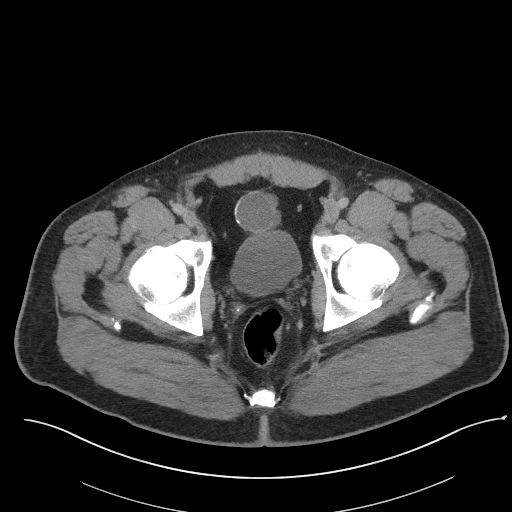
[im 28/106  soft-tissue]
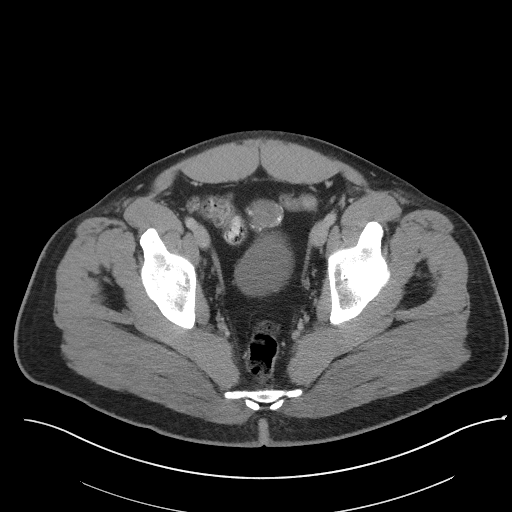
[im 39/106  soft-tissue]
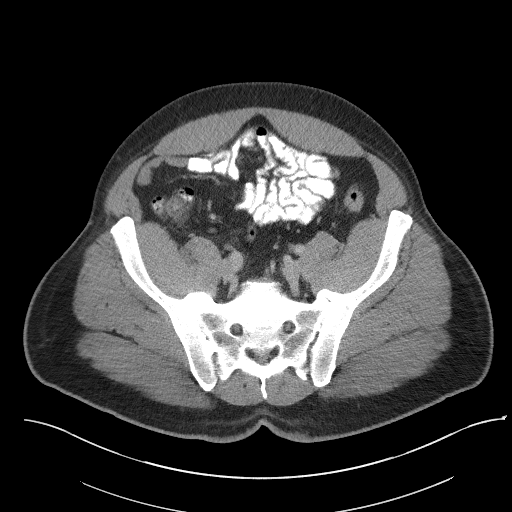
[im 45/106  soft-tissue]
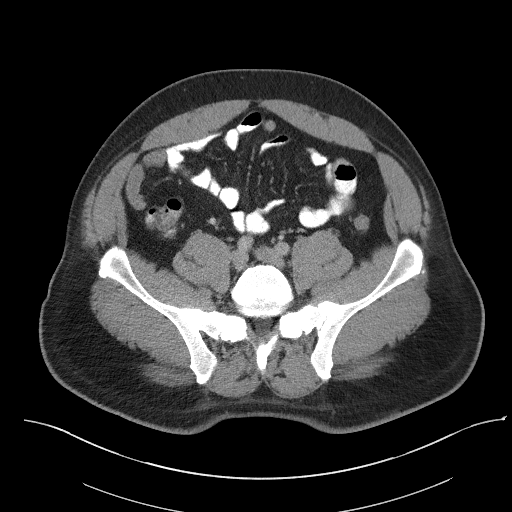
[im 56/106  soft-tissue]
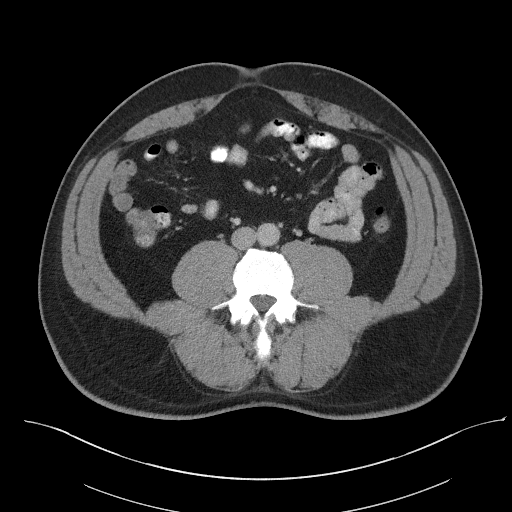
[im 61/106  soft-tissue]
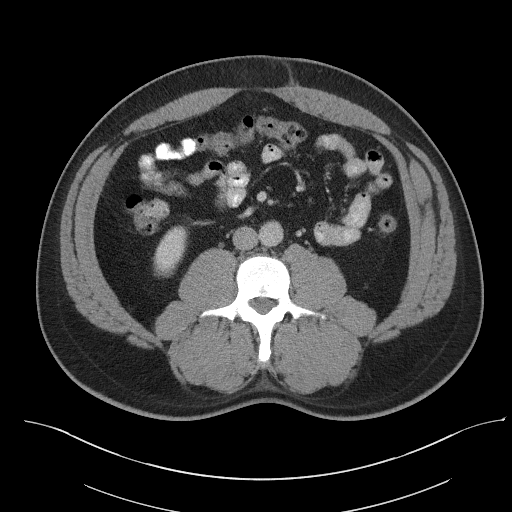
[im 67/106  soft-tissue]
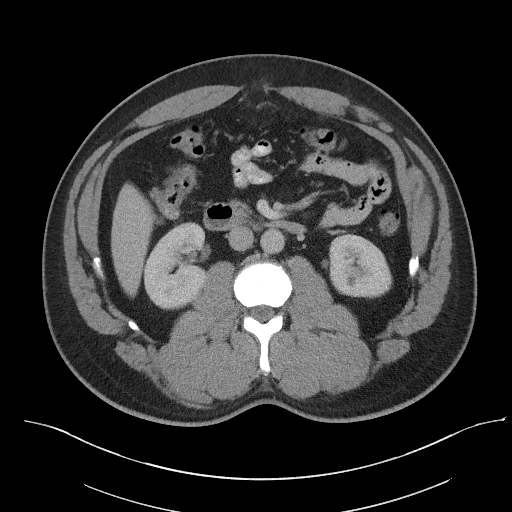
[im 67/106  bone]
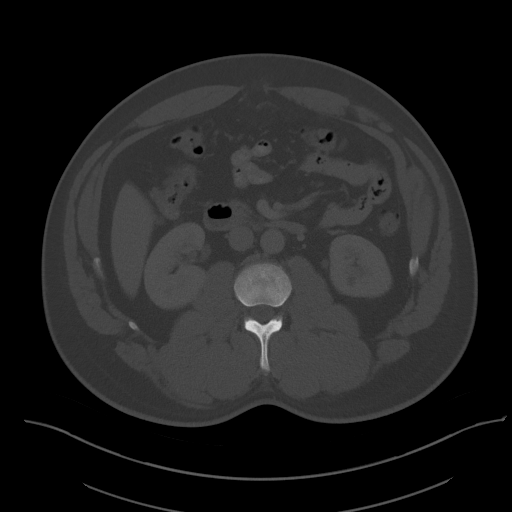
[im 78/106  soft-tissue]
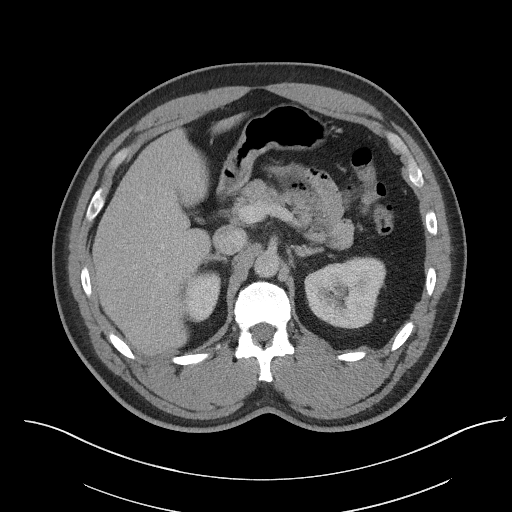
[im 83/106  soft-tissue]
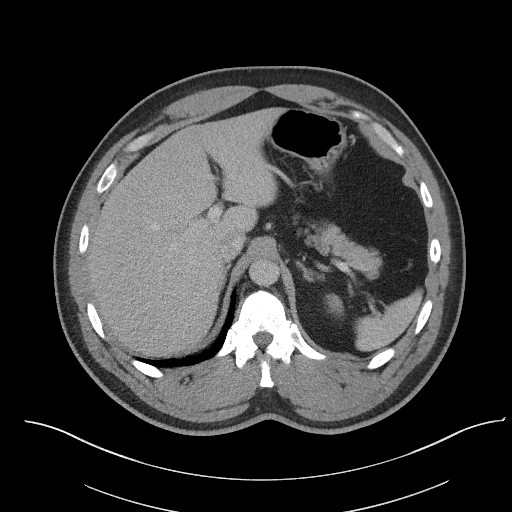
[im 89/106  soft-tissue]
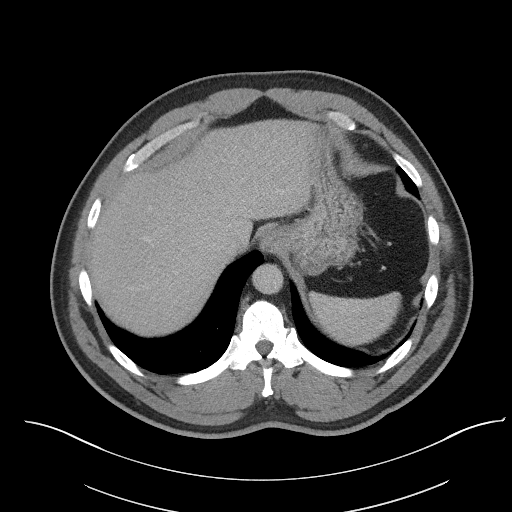
[im 100/106  soft-tissue]
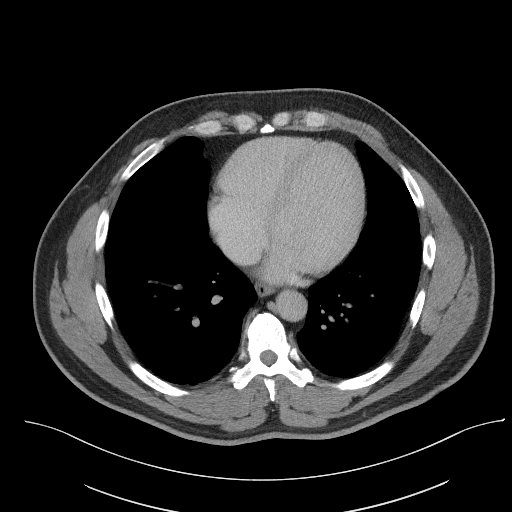

[Series 5: coronal st · coronal · 0.86mm/px · 3 of 116 slices shown]
[im 39/116  soft-tissue]
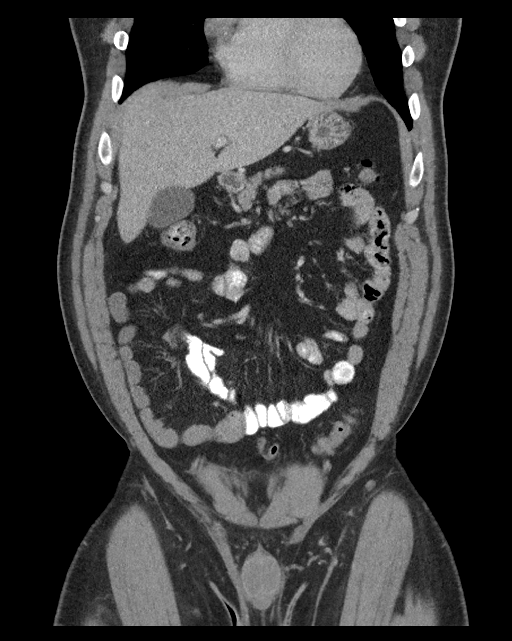
[im 52/116  soft-tissue]
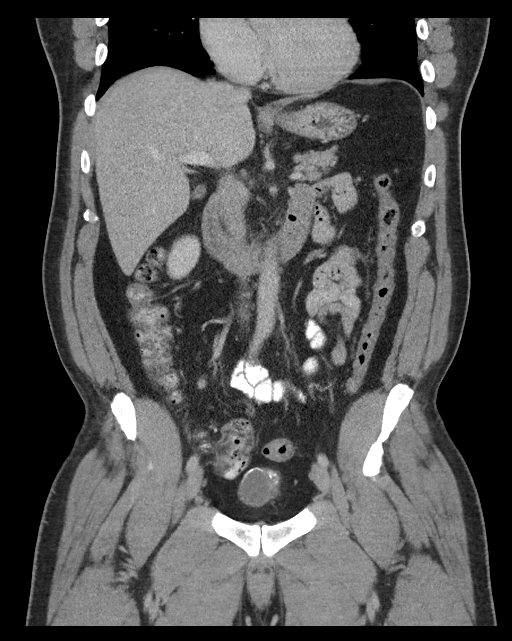
[im 64/116  soft-tissue]
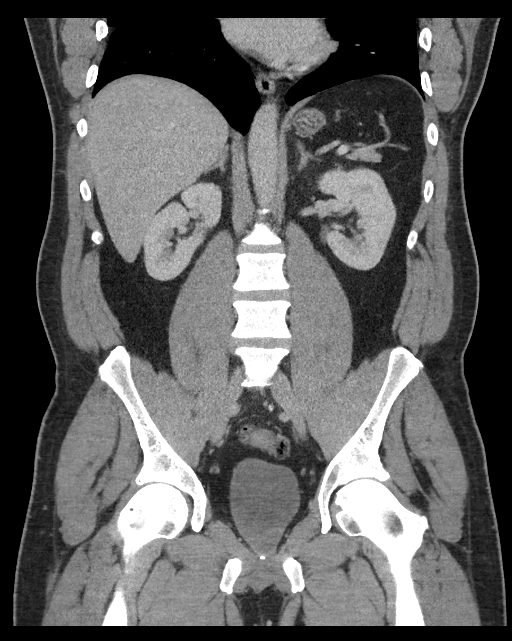

[16 of 46 positions shown; findings below may reference images not displayed]

FINDINGS: Lower chest:  No contributory findings.

Hepatobiliary: No focal liver abnormality.No evidence of biliary
obstruction or stone.

Pancreas: Unremarkable.

Spleen: Unremarkable.

Adrenals/Urinary Tract: Negative adrenals. No hydronephrosis or
stone. Peripherally calcified cystic density anterior to the bladder
measuring 4.4 cm, intimately associated with the wall but not
clearly a diverticulum. There is been no change from prior. No
communication with the umbilicus to implicate a urachal remnant.
Patient has had continence surgery in the past and this may be
related to prior surgery or device explant

Stomach/Bowel:  No obstruction. No appendicitis.

Vascular/Lymphatic: No acute vascular abnormality. No mass or
adenopathy.

Reproductive:Prostatectomy.

Other: Fatty umbilical and supraumbilical hernias with mild fat
reticulation at the upper hernia that is similar to prior.

Musculoskeletal: No acute abnormalities.
IMPRESSION: 1. Umbilical and supraumbilical fatty hernias that are similar to
July 2018 CT.
2. Chronic, 4.4 cm peripherally calcified cystic structure ventral
to the bladder which may be related to history of prior
genitourinary surgery. This could be addressed at urologic
follow-up.

## 2022-03-22 ENCOUNTER — Encounter (HOSPITAL_BASED_OUTPATIENT_CLINIC_OR_DEPARTMENT_OTHER): Payer: Self-pay | Admitting: *Deleted

## 2022-03-22 ENCOUNTER — Emergency Department (HOSPITAL_BASED_OUTPATIENT_CLINIC_OR_DEPARTMENT_OTHER)
Admission: EM | Admit: 2022-03-22 | Discharge: 2022-03-22 | Disposition: A | Discharge status: Home / Self Care | Payer: Commercial Managed Care - HMO | Attending: Emergency Medicine | Admitting: Emergency Medicine

## 2022-03-22 DIAGNOSIS — S61209A Unspecified open wound of unspecified finger without damage to nail, initial encounter: Secondary | ICD-10-CM

## 2022-03-22 DIAGNOSIS — S65500A Unspecified injury of blood vessel of right index finger, initial encounter: Secondary | ICD-10-CM | POA: Diagnosis present

## 2022-03-22 DIAGNOSIS — S61210A Laceration without foreign body of right index finger without damage to nail, initial encounter: Secondary | ICD-10-CM | POA: Insufficient documentation

## 2022-03-22 DIAGNOSIS — W3182XA Contact with other commercial machinery, initial encounter: Secondary | ICD-10-CM | POA: Diagnosis not present

## 2022-03-22 MED ORDER — LIDOCAINE HCL (PF) 1 % IJ SOLN
5.0000 mL | Freq: Once | INTRAMUSCULAR | Status: DC
  Filled 2022-03-22: qty 5

## 2022-03-22 NOTE — ED Provider Notes (Signed)
[place] EMERGENCY DEPARTMENT Provider Note   CSN: [id number] Arrival date & time: 03/22/22  0701     History  Chief Complaint  Patient presents with   Finger Injury    Harold Perez is a 51 y.o. male.  Pt is a 51 yo male presenting for injury to finger. Pt states yesterday evening he peeled off the skin of this right index finger with a potato peeler. States bleeding is uncontrolled at this time. No sensation or motor deficits. Tdap received in 2015.  The history is provided by the patient. No language interpreter was used.       Home Medications Prior to Admission medications   Not on File      Allergies    Patient has no known allergies.    Review of Systems   Review of Systems  Constitutional:  Negative for chills and fever.  Skin:  Positive for wound. Negative for color change.  Neurological:  Negative for weakness and numbness.    Physical Exam Updated Vital Signs BP 128/86 (BP Location: Left Arm)   Pulse 75   Temp 98.2 F (36.8 C) (Oral)   Resp 18   Ht '6\' 2"'$  (1.88 m)   Wt 120.2 kg   SpO2 98%   BMI 34.02 kg/m  Physical Exam Vitals and nursing note reviewed.  Constitutional:      Appearance: Normal appearance.  HENT:     Head: Normocephalic and atraumatic.  Cardiovascular:     Rate and Rhythm: Normal rate and regular rhythm.     Pulses:          Radial pulses are 2+ on the right side.  Pulmonary:     Effort: Pulmonary effort is normal.     Breath sounds: Normal breath sounds.  Skin:    Capillary Refill: Capillary refill takes less than 2 seconds.       Neurological:     General: No focal deficit present.     Mental Status: He is alert.     GCS: GCS eye subscore is 4. GCS verbal subscore is 5. GCS motor subscore is 6.     Sensory: Sensation is intact.     Motor: Motor function is intact.     ED Results / Procedures / Treatments   Labs (all labs ordered are listed, but only abnormal results are displayed) Labs Reviewed  - No data to display  EKG None  Radiology No results found.  Procedures Procedures    Medications Ordered in ED Medications  lidocaine (PF) (XYLOCAINE) 1 % injection 5 mL (has no administration in time range)    ED Course/ Medical Decision Making/ A&P                           Medical Decision Making Risk Prescription drug management.   7:52 AM  51 yo male presenting for injury to finger.  Patient is alert and oriented x3, no acute distress, afebrile, stable vital signs.  Physical exam demonstrates 1 x 1 cm skin avulsion on the right distal index finger.  Finger neurovascularly intact.  Bleeding controlled.  No foreign bodies.  Tdap up-to-date 2015.  Wound irrigated and cleaned with normal saline, covered in bacitracin ointment, and non-adherent gauze applied with gauze wrap.  Compartments are soft.  Nerve block offered for comfort and declined.  Patient recommended take Motrin Tylenol as needed for pain, apply bacitracin daily, and keep the area clean and dry.  No need for follow-up unless there are signs of infection including redness, swelling, purulent drainage, or increasing pain.  Patient in no distress and overall condition improved here in the ED. Detailed discussions were had with the patient regarding current findings, and need for close f/u with PCP or on call doctor. The patient has been instructed to return immediately if the symptoms worsen in any way for re-evaluation. Patient verbalized understanding and is in agreement with current care plan. All questions answered prior to discharge.         Final Clinical Impression(s) / ED Diagnoses Final diagnoses:  Avulsion of skin of finger, initial encounter    Rx / DC Orders ED Discharge Orders     None         Harold Cure, DO 57/84/69 [id number]

## 2022-03-22 NOTE — ED Triage Notes (Signed)
Right index laceration yesterday with a potato peeler. Unable to get bleeding under control.

## 2022-03-22 NOTE — Discharge Instructions (Addendum)
Take Motrin Tylenol as needed for pain, apply bacitracin daily, and keep the area clean and dry. Washing hands and showering is okay.  No need for follow-up unless there are signs of infection including redness, swelling, purulent drainage, or increasing pain. Tdap vaccine for tetanus up to date.
